# Patient Record
Sex: Female | Born: 1940 | Race: White | Hispanic: No | Marital: Married | Smoking: Former smoker
Health system: Southern US, Community
[De-identification: ages and names within clinical notes are randomized; demographics above are authoritative.]

## PROBLEM LIST (undated history)

## (undated) DIAGNOSIS — I10 Essential (primary) hypertension: Secondary | ICD-10-CM

## (undated) DIAGNOSIS — M8430XA Stress fracture, unspecified site, initial encounter for fracture: Secondary | ICD-10-CM

## (undated) DIAGNOSIS — F439 Reaction to severe stress, unspecified: Secondary | ICD-10-CM

## (undated) DIAGNOSIS — E7800 Pure hypercholesterolemia, unspecified: Secondary | ICD-10-CM

## (undated) HISTORY — DX: Essential (primary) hypertension: I10

## (undated) HISTORY — PX: RECTAL SURGERY: SHX760

## (undated) HISTORY — DX: Stress fracture, unspecified site, initial encounter for fracture: M84.30XA

## (undated) HISTORY — DX: Pure hypercholesterolemia, unspecified: E78.00

## (undated) HISTORY — DX: Reaction to severe stress, unspecified: F43.9

---

## 1971-07-09 HISTORY — PX: OTHER SURGICAL HISTORY: SHX169

## 1998-02-08 ENCOUNTER — Other Ambulatory Visit: Admission: RE | Admit: 1998-02-08 | Discharge: 1998-02-08 | Payer: Self-pay | Admitting: Gynecology

## 1999-02-13 ENCOUNTER — Other Ambulatory Visit: Admission: RE | Admit: 1999-02-13 | Discharge: 1999-02-13 | Payer: Self-pay | Admitting: Gynecology

## 1999-04-24 ENCOUNTER — Encounter: Payer: Self-pay | Admitting: Gynecology

## 1999-04-24 ENCOUNTER — Encounter: Admission: RE | Admit: 1999-04-24 | Discharge: 1999-04-24 | Payer: Self-pay | Admitting: Gynecology

## 1999-07-16 ENCOUNTER — Encounter: Admission: RE | Admit: 1999-07-16 | Discharge: 1999-07-16 | Payer: Self-pay | Admitting: Sports Medicine

## 1999-07-16 ENCOUNTER — Encounter: Payer: Self-pay | Admitting: Sports Medicine

## 2000-02-22 ENCOUNTER — Other Ambulatory Visit: Admission: RE | Admit: 2000-02-22 | Discharge: 2000-02-22 | Payer: Self-pay | Admitting: Gynecology

## 2000-04-25 ENCOUNTER — Encounter: Admission: RE | Admit: 2000-04-25 | Discharge: 2000-04-25 | Payer: Self-pay | Admitting: Gynecology

## 2000-04-25 ENCOUNTER — Encounter: Payer: Self-pay | Admitting: Gynecology

## 2001-01-27 ENCOUNTER — Encounter: Payer: Self-pay | Admitting: Sports Medicine

## 2001-01-27 ENCOUNTER — Encounter: Admission: RE | Admit: 2001-01-27 | Discharge: 2001-01-27 | Payer: Self-pay | Admitting: Sports Medicine

## 2001-03-24 ENCOUNTER — Other Ambulatory Visit: Admission: RE | Admit: 2001-03-24 | Discharge: 2001-03-24 | Payer: Self-pay | Admitting: Gynecology

## 2001-03-26 ENCOUNTER — Ambulatory Visit (HOSPITAL_COMMUNITY): Admission: RE | Admit: 2001-03-26 | Discharge: 2001-03-26 | Payer: Self-pay | Admitting: Sports Medicine

## 2001-03-26 ENCOUNTER — Encounter: Payer: Self-pay | Admitting: Sports Medicine

## 2001-05-12 ENCOUNTER — Encounter: Admission: RE | Admit: 2001-05-12 | Discharge: 2001-05-12 | Payer: Self-pay | Admitting: Gynecology

## 2001-05-12 ENCOUNTER — Encounter: Payer: Self-pay | Admitting: Gynecology

## 2002-04-12 ENCOUNTER — Other Ambulatory Visit: Admission: RE | Admit: 2002-04-12 | Discharge: 2002-04-12 | Payer: Self-pay | Admitting: Gynecology

## 2002-05-13 ENCOUNTER — Encounter: Payer: Self-pay | Admitting: Gynecology

## 2002-05-13 ENCOUNTER — Encounter: Admission: RE | Admit: 2002-05-13 | Discharge: 2002-05-13 | Payer: Self-pay | Admitting: Gynecology

## 2003-04-12 ENCOUNTER — Ambulatory Visit (HOSPITAL_COMMUNITY): Admission: RE | Admit: 2003-04-12 | Discharge: 2003-04-12 | Payer: Self-pay | Admitting: Gastroenterology

## 2003-04-12 ENCOUNTER — Encounter (INDEPENDENT_AMBULATORY_CARE_PROVIDER_SITE_OTHER): Payer: Self-pay | Admitting: Specialist

## 2003-04-18 ENCOUNTER — Other Ambulatory Visit: Admission: RE | Admit: 2003-04-18 | Discharge: 2003-04-18 | Payer: Self-pay | Admitting: Gynecology

## 2003-05-24 ENCOUNTER — Encounter: Admission: RE | Admit: 2003-05-24 | Discharge: 2003-05-24 | Payer: Self-pay | Admitting: Gynecology

## 2003-07-19 ENCOUNTER — Ambulatory Visit (HOSPITAL_COMMUNITY): Admission: RE | Admit: 2003-07-19 | Discharge: 2003-07-19 | Payer: Self-pay | Admitting: Gastroenterology

## 2003-07-19 ENCOUNTER — Encounter (INDEPENDENT_AMBULATORY_CARE_PROVIDER_SITE_OTHER): Payer: Self-pay | Admitting: *Deleted

## 2004-04-23 ENCOUNTER — Other Ambulatory Visit: Admission: RE | Admit: 2004-04-23 | Discharge: 2004-04-23 | Payer: Self-pay | Admitting: Gynecology

## 2004-06-06 ENCOUNTER — Encounter: Admission: RE | Admit: 2004-06-06 | Discharge: 2004-06-06 | Payer: Self-pay | Admitting: Gynecology

## 2004-06-18 ENCOUNTER — Encounter: Admission: RE | Admit: 2004-06-18 | Discharge: 2004-06-18 | Payer: Self-pay | Admitting: Gynecology

## 2004-09-27 ENCOUNTER — Ambulatory Visit (HOSPITAL_COMMUNITY): Admission: RE | Admit: 2004-09-27 | Discharge: 2004-09-27 | Payer: Self-pay | Admitting: Cardiology

## 2005-04-29 ENCOUNTER — Other Ambulatory Visit: Admission: RE | Admit: 2005-04-29 | Discharge: 2005-04-29 | Payer: Self-pay | Admitting: Gynecology

## 2005-07-11 ENCOUNTER — Encounter: Admission: RE | Admit: 2005-07-11 | Discharge: 2005-07-11 | Payer: Self-pay | Admitting: Gynecology

## 2005-09-18 ENCOUNTER — Ambulatory Visit (HOSPITAL_BASED_OUTPATIENT_CLINIC_OR_DEPARTMENT_OTHER): Admission: RE | Admit: 2005-09-18 | Discharge: 2005-09-18 | Payer: Self-pay | Admitting: Orthopedic Surgery

## 2006-07-23 ENCOUNTER — Encounter: Admission: RE | Admit: 2006-07-23 | Discharge: 2006-07-23 | Payer: Self-pay | Admitting: Cardiology

## 2006-08-01 ENCOUNTER — Encounter: Admission: RE | Admit: 2006-08-01 | Discharge: 2006-08-01 | Payer: Self-pay | Admitting: Cardiology

## 2007-02-27 ENCOUNTER — Encounter: Admission: RE | Admit: 2007-02-27 | Discharge: 2007-02-27 | Payer: Self-pay | Admitting: Gastroenterology

## 2007-06-03 ENCOUNTER — Other Ambulatory Visit: Admission: RE | Admit: 2007-06-03 | Discharge: 2007-06-03 | Payer: Self-pay | Admitting: Gynecology

## 2007-08-12 ENCOUNTER — Encounter: Admission: RE | Admit: 2007-08-12 | Discharge: 2007-08-12 | Payer: Self-pay | Admitting: Gynecology

## 2008-08-15 ENCOUNTER — Encounter: Admission: RE | Admit: 2008-08-15 | Discharge: 2008-08-15 | Payer: Self-pay | Admitting: Gynecology

## 2008-08-22 ENCOUNTER — Encounter: Admission: RE | Admit: 2008-08-22 | Discharge: 2008-08-22 | Payer: Self-pay | Admitting: Gynecology

## 2009-02-06 ENCOUNTER — Encounter: Admission: RE | Admit: 2009-02-06 | Discharge: 2009-02-06 | Payer: Self-pay | Admitting: Gynecology

## 2009-02-15 ENCOUNTER — Ambulatory Visit: Payer: Self-pay | Admitting: Vascular Surgery

## 2009-08-16 ENCOUNTER — Encounter: Admission: RE | Admit: 2009-08-16 | Discharge: 2009-08-16 | Payer: Self-pay | Admitting: Gynecology

## 2010-03-14 ENCOUNTER — Ambulatory Visit: Payer: Self-pay | Admitting: Cardiology

## 2010-03-19 ENCOUNTER — Ambulatory Visit: Payer: Self-pay | Admitting: Cardiology

## 2010-07-19 ENCOUNTER — Ambulatory Visit: Payer: Self-pay | Admitting: Cardiology

## 2010-07-25 ENCOUNTER — Ambulatory Visit: Payer: Self-pay | Admitting: Cardiology

## 2010-07-28 ENCOUNTER — Encounter: Payer: Self-pay | Admitting: Gynecology

## 2010-07-29 ENCOUNTER — Encounter: Payer: Self-pay | Admitting: Gynecology

## 2010-07-29 ENCOUNTER — Encounter: Payer: Self-pay | Admitting: Cardiology

## 2010-07-30 ENCOUNTER — Other Ambulatory Visit: Payer: Self-pay | Admitting: Internal Medicine

## 2010-07-30 DIAGNOSIS — Z1239 Encounter for other screening for malignant neoplasm of breast: Secondary | ICD-10-CM

## 2010-08-22 ENCOUNTER — Ambulatory Visit
Admission: RE | Admit: 2010-08-22 | Discharge: 2010-08-22 | Disposition: A | Discharge status: Home / Self Care | Payer: Medicare Other | Source: Ambulatory Visit | Attending: Internal Medicine | Admitting: Internal Medicine

## 2010-08-22 DIAGNOSIS — Z1239 Encounter for other screening for malignant neoplasm of breast: Secondary | ICD-10-CM

## 2010-10-07 ENCOUNTER — Other Ambulatory Visit: Payer: Self-pay | Admitting: Cardiology

## 2010-10-07 DIAGNOSIS — I10 Essential (primary) hypertension: Secondary | ICD-10-CM

## 2010-11-20 NOTE — Procedures (Signed)
LOWER EXTREMITY VENOUS REFLUX EXAM   INDICATION:  Lower extremity varicose veins.   EXAM:  Using color-flow imaging and pulse Doppler spectral analysis, the  right and left common femoral, superficial femoral, popliteal, posterior  tibial, greater and lesser saphenous veins are evaluated.  There is  evidence suggesting deep venous insufficiency in the right and left  lower extremities.   The left saphenofemoral junction is not competent, the right is within  normal limits.  The left GSV is not competent with the caliber as  described below, the right is within normal limits.   The right and left proximal short saphenous veins demonstrate  competency.   GSV Diameter (used if found to be incompetent only)                                            Right    Left  Proximal Greater Saphenous Vein           cm       0.75 cm  Proximal-to-mid-thigh                     cm         cm  Mid thigh                                 cm       0.49 cm  Mid-distal thigh                          cm         cm  Distal thigh                              cm       0.64 cm  Knee                                      cm       0.44 cm   IMPRESSION:  1. Left greater saphenous vein reflux is identified with the caliber      ranging from 0.44 cm to 0.79 cm knee to groin.  The right is within      normal limits.  2. The right and left greater saphenous veins are not aneurysmal.  3. The right and left greater saphenous veins are not tortuous.  4. The deep venous system is not competent.  5. The right and left lesser saphenous veins are competent.   ___________________________________________  Larina Earthly, M.D.   AS/MEDQ  D:  02/15/2009  T:  02/15/2009  Job:  [id number]

## 2010-11-20 NOTE — Consult Note (Signed)
NEW PATIENT CONSULTATION   Maxfield, Shyvonne A  DOB:  08-Feb-1941                                       02/15/2009  BJYNW#:[id number]   The patient presents today for evaluation of multiple lower extremity  complaints.  She is a very active 70 year old white female who reports  bilateral calf cramping at night.  She also has some bilateral lower  extremity swelling and notes telangiectasia on both thighs and more  pronounced reticular veins in her left anterior thigh and left popliteal  space.  She does not have any history of venous varicosities or  bleeding.   PAST MEDICAL HISTORY:  Is noted for hypertension and elevated  cholesterol.   SOCIAL HISTORY:  She is retired.  She does not smoke having quit nearly  40 years ago and does not drink alcohol.   REVIEW OF SYSTEMS:  Otherwise unremarkable.  Her weight is reported at  165 pounds.  She is 5 feet 7-1/2 inches tall.   ALLERGIES:  Iodine.   CURRENT MEDICATIONS:  Two blood pressure medicines which she is not  clear of the name.  She is on daily aspirin therapy and on vitamin D.   PHYSICAL EXAMINATION:  A well-developed, well-nourished white female  appearing stated age of 29.  Blood pressure is 126/77, heart rate 62,  respirations 18.  Her posterior tibial pulses are 2+ bilaterally.  She  is a well-developed white female in no acute distress.  She does not  have any swelling currently.  She does have scattered telangiectasia  over both thighs and both calves and does have some reticular  varicosities in her left anterior thigh and posterior popliteal fossa on  the left.   She underwent noninvasive vascular laboratory study and this did show  deep venous reflux bilaterally.  She had no reflux in her right great  saphenous vein and no reflux in her small saphenous vein bilaterally.  She did have reflux throughout her left great saphenous vein.  I had a  long discussion with the patient regarding this.  I  explained that this  is a safe condition and is not causing her any symptoms.  Since she is  having swelling bilaterally I would not recommend any treatment.  I did  explain that the deep venous reflux is treated only with elevation and  compression and that the reflux in her left great saphenous vein could  be treated with laser ablation but certainly would not recommend this  since she is minimally symptomatic.  I did explain the possibility of  sclerotherapy for the reticular veins and telangiectasia for cosmetic  concern.  She is not interested in this currently and will notify us  should she wish to proceed with this in the future.  She was reassured  with this discussion and will see [place] on an as-needed basis.   Larina Earthly, M.D.  Electronically Signed   TFE/MEDQ  D:  02/15/2009  T:  02/16/2009  Job:  [id number]   cc:   Dr Justin Mend, M.D.

## 2010-11-22 ENCOUNTER — Telehealth: Payer: Self-pay | Admitting: Cardiology

## 2010-11-22 DIAGNOSIS — R42 Dizziness and giddiness: Secondary | ICD-10-CM

## 2010-11-22 DIAGNOSIS — E7800 Pure hypercholesterolemia, unspecified: Secondary | ICD-10-CM

## 2010-11-22 DIAGNOSIS — Z79899 Other long term (current) drug therapy: Secondary | ICD-10-CM

## 2010-11-22 DIAGNOSIS — I10 Essential (primary) hypertension: Secondary | ICD-10-CM

## 2010-11-22 NOTE — Telephone Encounter (Signed)
Pt called said she is having prolems with BP but wouldn't tell me what kind Please call

## 2010-11-22 NOTE — Telephone Encounter (Signed)
Patient called for an appointment, was due for a month follow up.  Stated she has some "tingleing" all over today, but insisted she was ok.  Asked if blood pressure checked today and she stated no, but would check later on.  She stated next week ok.  Also having some dizziness, stated has had in past.  States still ok with waiting until next week.  Stated if she thought she needed to be seen she would be here.

## 2010-11-23 NOTE — Op Note (Signed)
   NAME:  Cynthia Jenkins, Cynthia Jenkins                          ACCOUNT NO.:  [id number]   MEDICAL RECORD NO.:  [id number]                   PATIENT TYPE:  AMB   LOCATION:  ENDO                                 FACILITY:  [facility]   PHYSICIAN:  Graylin Shiver, M.D.                DATE OF BIRTH:  1940-10-03   DATE OF PROCEDURE:  04/12/2003  DATE OF DISCHARGE:                                 OPERATIVE REPORT   PROCEDURE PERFORMED:  Esophagogastroduodenoscopy with biopsy.   ENDOSCOPIST:  Graylin Shiver, M.D.   INDICATIONS FOR PROCEDURE:  Epigastric pain.   Informed consent was obtained after the explanation of the risks of  bleeding, infection, perforation.   PREMEDICATIONS:  Fentanyl 25 mcg IV, Versed 2 mg IV.  The patient was also  given preprocedure antibiotics for subacute bacterial endocarditis  prophylaxis using ampicillin 2g IV and gentamicin 40 mg IV.   DESCRIPTION OF PROCEDURE:  With the patient in the left lateral decubitus  position the Olympus gastroscope was inserted into the oropharynx and passed  into the esophagus.  It was advanced down the esophagus and into the stomach  and into the duodenum.  The second portion and bulb of the duodenum looked  normal.  In the antrum of the stomach, there was a 5 mm prepyloric antral  ulcer, several biopsies were obtained from around this ulcer.  The body of  the stomach showed some gastritis as did the antrum.  The fundus and cardia  looked normal as far as the mucosa was concerned.  There was a small hiatal  hernia.  The esophagus showed a small healing erosion in the distal  esophagus just above the esophagogastric junction, which was at 35 cm.  The  mid and proximal esophagus looked normal.  The patient tolerated the  procedure well without complications.   IMPRESSION:  1. Antral ulcer.  2. Hiatal hernia.  3. Small healing erosion in the distal esophagus just above the     esophagogastric junction.   PLAN:  The biopsies will be checked.  I  will give the patient a prescription  for Aciphex 20 mg daily.  If Helicobacter pylori shows up on biopsies, it  will be treated.                                               Graylin Shiver, M.D.    [name]  D:  04/12/2003  T:  04/12/2003  Job:  [id number]   cc:   Cassell Clement, M.D.  [address]  [place]  [place] [address]  Fax: [phone]

## 2010-11-23 NOTE — Op Note (Signed)
NAME:  Cynthia Jenkins, Cynthia Jenkins                          ACCOUNT NO.:  [id number]   MEDICAL RECORD NO.:  [id number]                   PATIENT TYPE:  AMB   LOCATION:  ENDO                                 FACILITY:  [facility]   PHYSICIAN:  Graylin Shiver, M.D.                DATE OF BIRTH:  [birth year]   DATE OF PROCEDURE:  07/19/2003  DATE OF DISCHARGE:                                 OPERATIVE REPORT   PROCEDURE PERFORMED:  Colonoscopy with biopsy.   INDICATIONS FOR PROCEDURE:  Screening.   Informed consent was obtained after explanation of the risks of bleeding,  infection, and perforation.   PREMEDICATIONS:  Fentanyl 70 mcg  IV, Versed 7 mg IV.  The patient also  received preprocedure antibiotics of ampicillin 2 g IV, gentamicin 40 mg IV.   DESCRIPTION OF PROCEDURE:  With the patient in the left lateral decubitus  position, a rectal exam was performed and no masses were felt.  The Olympus  colonoscope was inserted into the rectum and advanced around the colon to  the cecum.  Cecal landmarks were identified.  The cecum was normal.  In the  midascending colon there was a small 3 mm sessile polyp removed with cold  forceps.  The transverse colon was normal.  The descending colon, sigmoid  and rectum were normal.  The patient tolerated the procedure well without  complications.   IMPRESSION:  Small polyp in the ascending colon.   PLAN:  The pathology will be checked.                                               Graylin Shiver, M.D.    Cynthia Jenkins  D:  07/19/2003  T:  07/19/2003  Job:  [id number]

## 2010-11-23 NOTE — Op Note (Signed)
Cynthia Jenkins, Cynthia Jenkins                ACCOUNT NO.:  [id number]   MEDICAL RECORD NO.:  [id number]          PATIENT TYPE:  AMB   LOCATION:  DSC                          FACILITY:  [facility]   PHYSICIAN:  Leonides Grills, M.D.     DATE OF BIRTH:  1940-10-14   DATE OF PROCEDURE:  09/18/2005  DATE OF DISCHARGE:                                 OPERATIVE REPORT   PREOPERATIVE DIAGNOSES:  1.  Left hallux valgus.  2.  Left fourth hammer toe.   POSTOPERATIVE DIAGNOSES:  1.  Left hallux valgus.  2.  Left fourth hammer toe.   OPERATION PERFORMED:  1.  Left chevron bunionectomy.  2.  Stress x-rays left foot.  3.  Left fourth toe metatarsophalangeal joint dorsal capsulotomy with      collateral release.  4.  Left fourth toe proximal phalanx head resection.  5.  Left fourth toe extensor digitorum brevis to extensor digitorum longus      tendon transfer.   SURGEON:  Leonides Grills, M.D.   ASSISTANT:  Lianne Cure, P.A.   ANESTHESIA:  General.   ESTIMATED BLOOD LOSS:  Minimal.   TOURNIQUET TIME:  Approximately 45 minutes.   COMPLICATIONS:  None.   DISPOSITION:  Stable to PR.   INDICATIONS FOR PROCEDURE:  The patient is a 70 year old female who has had  longstanding bunion as well as fourth toe pain. The pain was located in the  dorsal aspect of the PIP joint of the fourth toe and over the bunion area of  the left great toe.  This was persisting despite wearing wider toe box,  extra depth shoes as well as occasional anti-inflammatories.  The patient  has consented for the above procedure.  All risks which include infection,  neurovascular injury, nonunion, malunion, hardware irritation, hardware  failure, persistent pain, worsening pain, prolonged recovery, stiffness,  arthritis and possible fusion, recurrence of deformity, were all explained,  questions were encouraged and answered.   DESCRIPTION OF PROCEDURE:  The patient was brought to the operating room and  placed in supine position  after adequate general endotracheal tube  anesthesia was administered with block as well as Ancef 1 g IV piggyback.  The left lower extremity was then prepped and draped in sterile manner over  a proximally placed thigh tourniquet.  The wound was gravity exsanguinated  and tourniquet was elevated to 290 mmHg.  A longitudinal incision midline  over the medial aspect of the great toe MTP joint of the left foot was then  made.  Dissection was carried down through skin.  Hemostasis was obtained.  Neurovascular structures were identified both superiorly and inferiorly and  protected throughout the case. L-shaped capsulotomy was then made.  Simple  bunionectomy was then performed with a sagittal saw.  Ken-Johnson's ridge  was then rounded off with a rongeur.  Soft tissue was elevated superiorly  and inferiorly for the chevron osteotomy and screw placement.  Lateral  capsule was then released with a curved beaver blade.  Center of the head  was then identified and a chevron osteotomy was then created with a sagittal  saw.  Head was then translated approximately 3 mm laterally and affixed with  a 2.0 mm fully threaded cortical set screw using a 1.5 mm drill hole  respectively with the assistant applying axial compression.  There was  excellent purchase and maintenance of the correction.  Redundant bone  medially was then trimmed off with a sagittal saw.  Joint and area were  copiously irrigated with normal saline.  Capsule was reconstructed and  advanced superiorly and proximally with 2-0 Vicryl suture.  Stress x-rays  were obtained in the AP and lateral planes and showed toe in excellent  alignment, fixation, proper position and no gross motion across the  osteotomy site.  The wound was then copiously irrigated with normal saline.  Longitudinal  incision was made over the fourth toe.  Dissection was carried down through  skin and hemostasis was obtained.  Extensor digitorum longus and brevis  were  identified.  The longus tendon was tenotomized proximal and medial and the  brevis distal and lateral and retracted out of harm's way for later  transfer.  An MTP joint dorsal capsulotomy with collateral release was then  performed with a 15 blade scalpel with the assistant protecting the medial  and lateral neurovascular structures.  This had excellent release of the  tight capsule.  The distal aspect of the proximal phalanx head was then  skeletonized and the head was then removed with a rongeur followed by a bone  cutter.  The toe was then reduced and was adequately decompressed.  Area was  copiously irrigated with normal saline once again.  A 0.045 K-wire was  advanced antegrade through the middle and distal phalanx.  PIP joint was  then reduced and this was fired retrograde.  MTP joint and toe was held in a  reduced position and a K-wire was advanced across the MTP joint.  The K-wire  was bent cut and capped.  Skin relieving incisions were made on either side  of the K-wire as well.  EDB to EDL tendons were then transferred with 3-0  PDS suture.  This had an outstanding transfer and repair.  Again, the area  was copiously irrigated with normal saline.  Tourniquet was deflated,  hemostasis was obtained.  All wounds were closed with 4-0 nylon suture after  the wounds were copiously irrigated with normal saline.  Skin relieving  incisions were made on either side of the K-wire and again the K-wire was  bent, cut and capped.  Sterile dressing was applied.  Roger-Mann dressing  was applied. Hard sole shoe was applied.  The patient was stable to the PR.      Leonides Grills, M.D.  Electronically Signed     PB/MEDQ  D:  09/18/2005  T:  09/19/2005  Job:  [id number]

## 2010-11-26 ENCOUNTER — Other Ambulatory Visit (INDEPENDENT_AMBULATORY_CARE_PROVIDER_SITE_OTHER): Payer: Medicare Other | Admitting: *Deleted

## 2010-11-26 DIAGNOSIS — I10 Essential (primary) hypertension: Secondary | ICD-10-CM

## 2010-11-26 DIAGNOSIS — R42 Dizziness and giddiness: Secondary | ICD-10-CM

## 2010-11-26 DIAGNOSIS — E7800 Pure hypercholesterolemia, unspecified: Secondary | ICD-10-CM

## 2010-11-26 DIAGNOSIS — Z79899 Other long term (current) drug therapy: Secondary | ICD-10-CM

## 2010-11-26 LAB — BASIC METABOLIC PANEL
BUN: 13 mg/dL (ref 6–23)
CO2: 29 mEq/L (ref 19–32)
Calcium: 9 mg/dL (ref 8.4–10.5)
Creatinine, Ser: 0.9 mg/dL (ref 0.4–1.2)
GFR: 69.09 mL/min (ref >60.00)
Glucose, Bld: 99 mg/dL (ref 70–99)
Potassium: 4.1 mEq/L (ref 3.5–5.1)
Sodium: 140 mEq/L (ref 135–145)

## 2010-11-26 LAB — CBC WITH DIFFERENTIAL/PLATELET
Basophils Absolute: 0 10*3/uL (ref 0.0–0.1)
Basophils Relative: 0.3 % (ref 0.0–3.0)
Eosinophils Absolute: 0.2 10*3/uL (ref 0.0–0.7)
Eosinophils Relative: 2.3 % (ref 0.0–5.0)
HCT: 42.4 % (ref 36.0–46.0)
Hemoglobin: 14.4 g/dL (ref 12.0–15.0)
Lymphocytes Relative: 40.1 % (ref 12.0–46.0)
MCHC: 33.9 g/dL (ref 30.0–36.0)
MCV: 93.6 fl (ref 78.0–100.0)
Monocytes Absolute: 0.5 10*3/uL (ref 0.1–1.0)
Monocytes Relative: 6.5 % (ref 3.0–12.0)
Neutro Abs: 3.5 10*3/uL (ref 1.4–7.7)
Neutrophils Relative %: 50.8 % (ref 43.0–77.0)
RBC: 4.52 Mil/uL (ref 3.87–5.11)
RDW: 13.2 % (ref 11.5–14.6)
WBC: 7 10*3/uL (ref 4.5–10.5)

## 2010-11-26 LAB — HEPATIC FUNCTION PANEL
ALT: 28 U/L (ref 0–35)
AST: 24 U/L (ref 0–37)
Albumin: 3.2 g/dL — ABNORMAL LOW (ref 3.5–5.2)
Alkaline Phosphatase: 51 U/L (ref 39–117)
Bilirubin, Direct: 0.1 mg/dL (ref 0.0–0.3)
Total Protein: 5.9 g/dL — ABNORMAL LOW (ref 6.0–8.3)

## 2010-11-26 LAB — LIPID PANEL
Cholesterol: 154 mg/dL (ref 0–200)
HDL: 46.9 mg/dL (ref >39.00)
LDL Cholesterol: 87 mg/dL (ref 0–99)
Total CHOL/HDL Ratio: 3
Triglycerides: 99 mg/dL (ref 0.0–149.0)
VLDL: 19.8 mg/dL (ref 0.0–40.0)

## 2010-11-28 ENCOUNTER — Encounter: Payer: Self-pay | Admitting: *Deleted

## 2010-11-28 DIAGNOSIS — F439 Reaction to severe stress, unspecified: Secondary | ICD-10-CM | POA: Insufficient documentation

## 2010-11-28 DIAGNOSIS — E7800 Pure hypercholesterolemia, unspecified: Secondary | ICD-10-CM | POA: Insufficient documentation

## 2010-11-28 DIAGNOSIS — M8430XA Stress fracture, unspecified site, initial encounter for fracture: Secondary | ICD-10-CM | POA: Insufficient documentation

## 2010-11-29 ENCOUNTER — Encounter: Payer: Self-pay | Admitting: Cardiology

## 2010-11-29 ENCOUNTER — Ambulatory Visit (INDEPENDENT_AMBULATORY_CARE_PROVIDER_SITE_OTHER): Payer: Medicare Other | Admitting: Cardiology

## 2010-11-29 DIAGNOSIS — E7800 Pure hypercholesterolemia, unspecified: Secondary | ICD-10-CM

## 2010-11-29 DIAGNOSIS — I119 Hypertensive heart disease without heart failure: Secondary | ICD-10-CM | POA: Insufficient documentation

## 2010-11-29 NOTE — Assessment & Plan Note (Signed)
This 70 year old woman has A history of high cholesterol.She is presently on pravastatin 40 mg a day.  She is not having any side effects of myalgias from the pravastatin.  Her blood work results from earlier this week are satisfactory.  She is discouraged by her 3 pound weight gain.  She wondered if any of her medication was causing her to gain weight.  We reviewed all of her medicines and concluded that none of her medicines were causing weight gain.

## 2010-11-29 NOTE — Assessment & Plan Note (Signed)
The patient has a history of essential hypertension.  She is presently well controlled on a combination of losartan HCT and carvedilol.She is not having chest pain or shortness of breath.  She has not been as physically active over the spring and she no longer goes to the gym.  Some of her weight gain is secondary to the fact that she is not doing any gym exercises.

## 2010-11-29 NOTE — Progress Notes (Signed)
Cynthia Jenkins Date of Birth:  January 15, 1941 [facility] [address]   [address] [place], [place]  [address] [phone]           Fax   [phone]  History of Present Illness: This pleasant 70 normal is seen for a scheduled followup office visit.  She has a past history of essential hypertension and elevated cholesterol.  She does not have any history of coronary disease.  She underwent a nuclear stress test on 09/16/07 which showed no ischemia and good exercise tolerance and an ejection fraction of 65%.  With exercise she did have frequent PACs and PVCs and short runs of consecutive PVCs at peak exercise necessitating the ongoing need for a beta blocker of some type.  The patient had an echocardiogram 02/28/03 which showed normal left ventricle systolic and diastolic function and mild mitral regurgitation.  The patient had a perfusion lung scan in 2000 and because of dyspnea and she had a normal perfusion lung scan no evidence to suggest pulmonary embolus.  Since last visit she has been feeling well.  She's having no chest pain or shortness of breath.  She's not having any palpitations dizziness or syncope  Current Outpatient Prescriptions  Medication Sig Dispense Refill  . aspirin 81 MG tablet Take 81 mg by mouth daily.        . carvedilol (COREG) 25 MG tablet Take 25 mg by mouth 2 (two) times daily with a meal.        . Cholecalciferol (VITAMIN D PO) Take by mouth daily.        . Cyanocobalamin (VITAMIN B 12 PO) Take by mouth daily.        Marland Kitchen losartan-hydrochlorothiazide (HYZAAR) 100-25 MG per tablet TAKE 1 TABLET DAILY (PT TO DISCONTINUE AVALIDE AND BYSTOLIC)  90 tablet  3  . Omega-3 Fatty Acids (FISH OIL PO) Take by mouth 2 (two) times daily.        . Omeprazole (PRILOSEC PO) Take by mouth daily.        . pravastatin (PRAVACHOL) 40 MG tablet Take 40 mg by mouth daily.          Allergies  Allergen Reactions  . Aceon (Perindopril Erbumine)     cough  . Codeine    . Crestor (Rosuvastatin Calcium)     aching  . Diovan (Valsartan)     Dizzy,rash  . Norvasc (Amlodipine Besylate)   . Zetia (Ezetimibe)     cramps    Patient Active Problem List  Diagnoses  . Hypercholesterolemia  . Situational stress  . Stress fracture  . Benign hypertensive heart disease without heart failure    History  Smoking status  . Former Smoker  . Types: Cigarettes  Smokeless tobacco  . Not on file    History  Alcohol Use     Family History  Problem Relation Age of Onset  . Heart disease Mother   . Diabetes Mother   . Emphysema Father   . Ovarian cancer Sister   . Coronary artery disease Brother     Review of Systems: Constitutional: no fever chills diaphoresis or fatigue or change in weight.  Head and neck: no hearing loss, no epistaxis, no photophobia or visual disturbance. Respiratory: No cough, shortness of breath or wheezing. Cardiovascular: No chest pain peripheral edema, palpitations. Gastrointestinal: No abdominal distention, no abdominal pain, no change in bowel habits hematochezia or melena. Genitourinary: No dysuria, no frequency, no urgency, no nocturia. Musculoskeletal:No arthralgias, no  back pain, no gait disturbance or myalgias. Neurological: No dizziness, no headaches, no numbness, no seizures, no syncope, no weakness, no tremors. Hematologic: No lymphadenopathy, no easy bruising. Psychiatric: No confusion, no hallucinations, no sleep disturbance.    Physical Exam: Filed Vitals:   11/29/10 1551  BP: 126/78  Pulse: 78  The general appearance reveals a well-developed well-nourished woman in no distress.Pupils equal and reactive.   Extraocular Movements are full.  There is no scleral icterus.  The mouth and pharynx are normal.  The neck is supple.  The carotids reveal no bruits.  The jugular venous pressure is normal.  The thyroid is not enlarged.  There is no lymphadenopathy.The chest is clear to percussion and auscultation. There  are no rales or rhonchi. Expansion of the chest is symmetrical.The precordium is quiet.  The first heart sound is normal.  The second heart sound is physiologically split.  There is no murmur gallop rub or click.  There is no abnormal lift or heave.The abdomen is soft and nontender. Bowel sounds are normal. The liver and spleen are not enlarged. There Are no abdominal masses. There are no bruits.The pedal pulses are good.  There is no phlebitis or edema.  There is no cyanosis or clubbing.Strength is normal and symmetrical in all extremities.  There is no lateralizing weakness.  There are no sensory deficits.The skin is warm and dry.  There is no rash.   Assessment / Plan:  We reviewed her labs which are satisfactory.  She is to continue same med and be rechecked in 4 months and she'll come in ahead of time for lab work

## 2011-01-01 ENCOUNTER — Other Ambulatory Visit: Payer: Self-pay | Admitting: Cardiology

## 2011-01-01 DIAGNOSIS — E785 Hyperlipidemia, unspecified: Secondary | ICD-10-CM

## 2011-01-01 NOTE — Telephone Encounter (Signed)
escribe request  

## 2011-02-09 ENCOUNTER — Other Ambulatory Visit: Payer: Self-pay | Admitting: Cardiology

## 2011-02-09 DIAGNOSIS — I119 Hypertensive heart disease without heart failure: Secondary | ICD-10-CM

## 2011-02-11 NOTE — Telephone Encounter (Signed)
escribe request  

## 2011-03-28 ENCOUNTER — Ambulatory Visit (INDEPENDENT_AMBULATORY_CARE_PROVIDER_SITE_OTHER): Payer: Medicare Other | Admitting: *Deleted

## 2011-03-28 DIAGNOSIS — E7800 Pure hypercholesterolemia, unspecified: Secondary | ICD-10-CM

## 2011-03-28 DIAGNOSIS — I119 Hypertensive heart disease without heart failure: Secondary | ICD-10-CM

## 2011-03-28 LAB — BASIC METABOLIC PANEL
Calcium: 9.1 mg/dL (ref 8.4–10.5)
Creatinine, Ser: 0.8 mg/dL (ref 0.4–1.2)
GFR: 76.13 mL/min (ref >60.00)

## 2011-03-28 LAB — LIPID PANEL
Cholesterol: 157 mg/dL (ref 0–200)
Triglycerides: 84 mg/dL (ref 0.0–149.0)

## 2011-03-28 LAB — HEPATIC FUNCTION PANEL
AST: 27 U/L (ref 0–37)
Albumin: 3.7 g/dL (ref 3.5–5.2)
Alkaline Phosphatase: 57 U/L (ref 39–117)
Total Protein: 6.5 g/dL (ref 6.0–8.3)

## 2011-04-01 ENCOUNTER — Encounter: Payer: Self-pay | Admitting: Cardiology

## 2011-04-01 ENCOUNTER — Ambulatory Visit (INDEPENDENT_AMBULATORY_CARE_PROVIDER_SITE_OTHER): Payer: Medicare Other | Admitting: Cardiology

## 2011-04-01 VITALS — BP 122/80 | HR 60 | Wt 172.0 lb

## 2011-04-01 DIAGNOSIS — E7800 Pure hypercholesterolemia, unspecified: Secondary | ICD-10-CM

## 2011-04-01 DIAGNOSIS — Z733 Stress, not elsewhere classified: Secondary | ICD-10-CM

## 2011-04-01 DIAGNOSIS — I119 Hypertensive heart disease without heart failure: Secondary | ICD-10-CM

## 2011-04-01 DIAGNOSIS — F439 Reaction to severe stress, unspecified: Secondary | ICD-10-CM

## 2011-04-01 NOTE — Assessment & Plan Note (Signed)
The patient has a history of dyslipidemia.  She is careful with her diet.  Her weight is down since last visit.   she is presently tolerating pravastatin 40 mg daily.She denies any myalgias.We reviewed her blood work from 4 days ago which shows that her lipid control is satisfactory with an LDL of 91

## 2011-04-01 NOTE — Progress Notes (Signed)
Cynthia Jenkins Date of Birth:  05-01-1941 [facility] [address]   [address] [place], [place]  [address] [phone]           Fax   [phone]  History of Present Illness: This pleasant 70 year old Caucasian female is seen for a scheduled followup office visit.  She has a past history of essential hypertension.  She also has a history of dyslipidemia.  She does not have any history of coronary artery disease.  She underwent a nuclear stress test on 09/16/07 which did not show any ischemia and showed an ejection fraction of 65%.  With exercise she has frequent PACs and PVCs and short runs of consecutive PVCs which have been successfully suppressed with beta blocker.  Her echocardiogram in 2004 showed normal systolic and diastolic function and mild mitral regurgitation.  She had a perfusion lung scan in 2000 which did not show any evidence of pulmonary embolus.  Current Outpatient Prescriptions  Medication Sig Dispense Refill  . aspirin 81 MG tablet Take 81 mg by mouth daily.        . carvedilol (COREG) 25 MG tablet TAKE 1 TABLET TWICE A DAY  180 tablet  3  . Cholecalciferol (VITAMIN D PO) Take by mouth daily.        . Cyanocobalamin (VITAMIN B 12 PO) Take by mouth daily.        Marland Kitchen losartan-hydrochlorothiazide (HYZAAR) 100-25 MG per tablet TAKE 1 TABLET DAILY (PT TO DISCONTINUE AVALIDE AND BYSTOLIC)  90 tablet  3  . Omega-3 Fatty Acids (FISH OIL PO) Take by mouth 2 (two) times daily.        . Omeprazole (PRILOSEC PO) Take by mouth daily.        . pravastatin (PRAVACHOL) 40 MG tablet TAKE 1 TABLET DAILY  90 tablet  3    Allergies  Allergen Reactions  . Aceon (Perindopril Erbumine)     cough  . Codeine   . Crestor (Rosuvastatin Calcium)     aching  . Diovan (Valsartan)     Dizzy,rash  . Norvasc (Amlodipine Besylate)   . Zetia (Ezetimibe)     cramps    Patient Active Problem List  Diagnoses  . Hypercholesterolemia  . Situational stress  . Stress  fracture  . Benign hypertensive heart disease without heart failure    History  Smoking status  . Former Smoker  . Types: Cigarettes  Smokeless tobacco  . Not on file    History  Alcohol Use     Family History  Problem Relation Age of Onset  . Heart disease Mother   . Diabetes Mother   . Emphysema Father   . Ovarian cancer Sister   . Coronary artery disease Brother     Review of Systems: Constitutional: no fever chills diaphoresis or fatigue or change in weight.  Head and neck: no hearing loss, no epistaxis, no photophobia or visual disturbance. Respiratory: No cough, shortness of breath or wheezing. Cardiovascular: No chest pain peripheral edema, palpitations. Gastrointestinal: No abdominal distention, no abdominal pain, no change in bowel habits hematochezia or melena. Genitourinary: No dysuria, no frequency, no urgency, no nocturia. Musculoskeletal:No arthralgias, no back pain, no gait disturbance or myalgias. Neurological: No dizziness, no headaches, no numbness, no seizures, no syncope, no weakness, no tremors. Hematologic: No lymphadenopathy, no easy bruising. Psychiatric: No confusion, no hallucinations, no sleep disturbance.    Physical Exam: Filed Vitals:   04/01/11 0911  BP: 122/80  Pulse: 60  The general appearance reveals a well-developed well-nourished woman in no distress.  Pupils equal and reactive.   Extraocular Movements are full.  There is no scleral icterus.  The mouth and pharynx are normal.  The neck is supple.  The carotids reveal no bruits.  The jugular venous pressure is normal.  The thyroid is not enlarged.  There is no lymphadenopathy.  The chest is clear to percussion and auscultation. There are no rales or rhonchi. Expansion of the chest is symmetrical.  The precordium is quiet.  The first heart sound is normal.  The second heart sound is physiologically split.  There is no murmur gallop rub or click.  There is no abnormal lift or  heave.  The abdomen is soft and nontender. Bowel sounds are normal. The liver and spleen are not enlarged. There Are no abdominal masses. There are no bruits.  Normal extremity without phlebitis or edema.  Pedal pulses are present.Strength is normal and symmetrical in all extremities.  There is no lateralizing weakness.  There are no sensory deficits.  The skin is warm and dry.  There is no rash.      Assessment / Plan: The patient will continue same medication.  Recheck in 4 months for followup office visit And lab work

## 2011-04-01 NOTE — Assessment & Plan Note (Signed)
The patient has not been expressing any chest pain or shortness of breath.  She denies any palpitations dizziness or syncope his not having any side effects from her present beta blocker or losartan hydrochlorothiazide combination.  She is walking a mile most days for exercise

## 2011-04-01 NOTE — Assessment & Plan Note (Signed)
The patient is still under a lot of situational stress related to her daughters divorce and child custody battles etc.

## 2011-06-17 ENCOUNTER — Encounter: Payer: Self-pay | Admitting: Cardiology

## 2011-07-29 ENCOUNTER — Other Ambulatory Visit: Payer: Self-pay | Admitting: Internal Medicine

## 2011-07-29 DIAGNOSIS — Z1231 Encounter for screening mammogram for malignant neoplasm of breast: Secondary | ICD-10-CM

## 2011-08-01 ENCOUNTER — Encounter: Payer: Self-pay | Admitting: Cardiology

## 2011-08-01 ENCOUNTER — Ambulatory Visit (INDEPENDENT_AMBULATORY_CARE_PROVIDER_SITE_OTHER): Payer: Medicare Other | Admitting: Cardiology

## 2011-08-01 ENCOUNTER — Other Ambulatory Visit: Payer: Medicare Other | Admitting: *Deleted

## 2011-08-01 VITALS — BP 120/80 | HR 70 | Ht 67.0 in | Wt 165.0 lb

## 2011-08-01 DIAGNOSIS — E7800 Pure hypercholesterolemia, unspecified: Secondary | ICD-10-CM

## 2011-08-01 DIAGNOSIS — I119 Hypertensive heart disease without heart failure: Secondary | ICD-10-CM

## 2011-08-01 DIAGNOSIS — Z733 Stress, not elsewhere classified: Secondary | ICD-10-CM

## 2011-08-01 DIAGNOSIS — F439 Reaction to severe stress, unspecified: Secondary | ICD-10-CM

## 2011-08-01 NOTE — Assessment & Plan Note (Signed)
The patient denies any headaches or dizzy spells.  She's having no chest pain or shortness of breath or palpitations.

## 2011-08-01 NOTE — Assessment & Plan Note (Signed)
The patient brought with her some recent blood work from her primary care physician.  Her lipids are satisfactory.  She will continue same medication.

## 2011-08-01 NOTE — Progress Notes (Signed)
Cynthia Jenkins Date of Birth:  04/18/1941 [facility] [address] [place], [place]  [address] [phone]  Fax   [phone]  HPI: This pleasant 71 year old woman is seen for a four-month followup office visit.  She has a history of hypercholesterolemia and essential hypertension.  Since last visit she's been doing well.  She has been careful with her diet and she has lost 7 pounds.  She has not been  Experiencing any chest pain or shortness of breath.  Current Outpatient Prescriptions  Medication Sig Dispense Refill  . aspirin 81 MG tablet Take 81 mg by mouth daily.        . carvedilol (COREG) 25 MG tablet TAKE 1 TABLET TWICE A DAY  180 tablet  3  . Cholecalciferol (VITAMIN D PO) Take by mouth daily.        . Cyanocobalamin (VITAMIN B 12 PO) Take by mouth daily.        Marland Kitchen losartan-hydrochlorothiazide (HYZAAR) 100-25 MG per tablet TAKE 1 TABLET DAILY (PT TO DISCONTINUE AVALIDE AND BYSTOLIC)  90 tablet  3  . Omega-3 Fatty Acids (FISH OIL PO) Take by mouth 2 (two) times daily.        . Omeprazole (PRILOSEC PO) Take by mouth daily.        . pravastatin (PRAVACHOL) 40 MG tablet TAKE 1 TABLET DAILY  90 tablet  3    Allergies  Allergen Reactions  . Aceon (Perindopril Erbumine)     cough  . Codeine   . Crestor (Rosuvastatin Calcium)     aching  . Diovan (Valsartan)     Dizzy,rash  . Norvasc (Amlodipine Besylate)   . Zetia (Ezetimibe)     cramps    Patient Active Problem List  Diagnoses  . Hypercholesterolemia  . Situational stress  . Stress fracture  . Benign hypertensive heart disease without heart failure    History  Smoking status  . Former Smoker  . Types: Cigarettes  Smokeless tobacco  . Not on file    History  Alcohol Use     Family History  Problem Relation Age of Onset  . Heart disease Mother   . Diabetes Mother   . Emphysema Father   . Ovarian cancer Sister   . Coronary artery disease Brother     Review of Systems: The  patient denies any heat or cold intolerance.  No weight gain or weight loss.  The patient denies headaches or blurry vision.  There is no cough or sputum production.  The patient denies dizziness.  There is no hematuria or hematochezia.  The patient denies any muscle aches or arthritis.  The patient denies any rash.  The patient denies frequent falling or instability.  There is no history of depression or anxiety.  All other systems were reviewed and are negative.   Physical Exam: Filed Vitals:   08/01/11 0946  BP: 120/80  Pulse: 70   The general appearance reveals a well-developed well-nourished woman in no distress.The head and neck exam reveals pupils equal and reactive.  Extraocular movements are full.  There is no scleral icterus.  The mouth and pharynx are normal.  The neck is supple.  The carotids reveal no bruits.  The jugular venous pressure is normal.  The  thyroid is not enlarged.  There is no lymphadenopathy.  The chest is clear to percussion and auscultation.  There are no rales or rhonchi.  Expansion of the chest is symmetrical.  The precordium is quiet.  The first heart sound is normal.  The second heart sound is physiologically split.  There is no murmur gallop rub or click.  There is no abnormal lift or heave.  The abdomen is soft and nontender.  The bowel sounds are normal.  The liver and spleen are not enlarged.  There are no abdominal masses.  There are no abdominal bruits.  Extremities reveal good pedal pulses.  There is no phlebitis or edema.  There is no cyanosis or clubbing.  Strength is normal and symmetrical in all extremities.  There is no lateralizing weakness.  There are no sensory deficits.  The skin is warm and dry.  There is no rash.     Assessment / Plan:  Continue same medication.  Recheck in 4 months for followup office visit EKG and lab work

## 2011-08-01 NOTE — Assessment & Plan Note (Signed)
The patient is having some increased heartburn symptoms related to stress.  Distress is related to her daughter's ongoing divorce proceedings.  I told her that if she needed to she could increase her omeprazole to twice a day.

## 2011-08-01 NOTE — Patient Instructions (Signed)
Your physician recommends that you return for lab work in: 4months for fasting cholesterol  Your physician wants you to follow-up in: 4 months with Dr. Rodney Langton will receive a reminder letter in the mail two months in advance. If you don't receive a letter, please call our office to schedule the follow-up appointment.  Your physician recommends that you continue on your current medications as directed. Please refer to the Current Medication list given to you today.

## 2011-08-18 ENCOUNTER — Other Ambulatory Visit: Payer: Self-pay | Admitting: Cardiology

## 2011-08-19 NOTE — Telephone Encounter (Signed)
Refilled losartan 

## 2011-08-28 ENCOUNTER — Ambulatory Visit
Admission: RE | Admit: 2011-08-28 | Discharge: 2011-08-28 | Disposition: A | Discharge status: Home / Self Care | Payer: Medicare Other | Source: Ambulatory Visit | Attending: Internal Medicine | Admitting: Internal Medicine

## 2011-08-28 DIAGNOSIS — Z1231 Encounter for screening mammogram for malignant neoplasm of breast: Secondary | ICD-10-CM

## 2011-11-27 ENCOUNTER — Other Ambulatory Visit: Payer: Self-pay | Admitting: Cardiology

## 2011-11-27 NOTE — Telephone Encounter (Signed)
Refilled pravastatin. 

## 2011-12-04 ENCOUNTER — Encounter: Payer: Self-pay | Admitting: Cardiology

## 2011-12-04 ENCOUNTER — Ambulatory Visit (INDEPENDENT_AMBULATORY_CARE_PROVIDER_SITE_OTHER): Payer: Medicare Other | Admitting: Cardiology

## 2011-12-04 ENCOUNTER — Other Ambulatory Visit (INDEPENDENT_AMBULATORY_CARE_PROVIDER_SITE_OTHER): Payer: Medicare Other

## 2011-12-04 VITALS — BP 115/65 | HR 53 | Ht 67.0 in | Wt 167.8 lb

## 2011-12-04 DIAGNOSIS — E7800 Pure hypercholesterolemia, unspecified: Secondary | ICD-10-CM

## 2011-12-04 DIAGNOSIS — I119 Hypertensive heart disease without heart failure: Secondary | ICD-10-CM

## 2011-12-04 LAB — BASIC METABOLIC PANEL
BUN: 18 mg/dL (ref 6–23)
CO2: 29 mEq/L (ref 19–32)
Chloride: 106 mEq/L (ref 96–112)
Glucose, Bld: 108 mg/dL — ABNORMAL HIGH (ref 70–99)
Potassium: 3.7 mEq/L (ref 3.5–5.1)
Sodium: 141 mEq/L (ref 135–145)

## 2011-12-04 LAB — HEPATIC FUNCTION PANEL
ALT: 19 U/L (ref 0–35)
AST: 21 U/L (ref 0–37)
Albumin: 3.3 g/dL — ABNORMAL LOW (ref 3.5–5.2)
Alkaline Phosphatase: 52 U/L (ref 39–117)

## 2011-12-04 LAB — LIPID PANEL: Cholesterol: 154 mg/dL (ref 0–200)

## 2011-12-04 NOTE — Assessment & Plan Note (Signed)
The patient has a history of hypercholesterolemia.  She is presently taking pravastatin.  Her recent blood work is satisfactory.  She's not having any myalgias or side effects.

## 2011-12-04 NOTE — Progress Notes (Signed)
Quick Note:  Please report to patient. The recent labs are stable. Continue same medication and careful diet. BS higher. Watch carbs. ______ 

## 2011-12-04 NOTE — Progress Notes (Signed)
Cynthia Jenkins Date of Birth:  24-Jul-1940 [facility] [address] [place], [place]  [address] [phone]  Fax   [phone]  HPI: This pleasant 71 year old woman is seen for a scheduled four-month followup office visit.  She has a history of hypercholesterolemia and essential hypertension.  Since last visit she has been doing well.  She feels that her blood pressure is too low at times.  She feels slightly dizzy or woozy at times.  He has not been expressing any chest discomfort.  Current Outpatient Prescriptions  Medication Sig Dispense Refill  . aspirin 81 MG tablet Take 81 mg by mouth daily.        . carvedilol (COREG) 25 MG tablet       . Cholecalciferol (VITAMIN D PO) Take by mouth daily.        . Cyanocobalamin (VITAMIN B 12 PO) Take by mouth daily.        Marland Kitchen losartan-hydrochlorothiazide (HYZAAR) 100-25 MG per tablet TAKE 1 TABLET DAILY (PT TO DISCONTINUE AVALIDE AND BYSTOLIC)  90 tablet  3  . Omega-3 Fatty Acids (FISH OIL PO) Take by mouth 2 (two) times daily.        . Omeprazole (PRILOSEC PO) Take by mouth daily.        . pravastatin (PRAVACHOL) 40 MG tablet TAKE 1 TABLET DAILY  90 tablet  3  . DISCONTD: carvedilol (COREG) 25 MG tablet TAKE 1 TABLET TWICE A DAY  180 tablet  3    Allergies  Allergen Reactions  . Aceon (Perindopril Erbumine)     cough  . Codeine   . Crestor (Rosuvastatin Calcium)     aching  . Diovan (Valsartan)     Dizzy,rash  . Norvasc (Amlodipine Besylate)   . Zetia (Ezetimibe)     cramps    Patient Active Problem List  Diagnoses  . Hypercholesterolemia  . Situational stress  . Stress fracture  . Benign hypertensive heart disease without heart failure    History  Smoking status  . Former Smoker  . Types: Cigarettes  Smokeless tobacco  . Not on file    History  Alcohol Use     Family History  Problem Relation Age of Onset  . Heart disease Mother   . Diabetes Mother   . Emphysema Father   . Ovarian cancer  Sister   . Coronary artery disease Brother     Review of Systems: The patient denies any heat or cold intolerance.  No weight gain or weight loss.  The patient denies headaches or blurry vision.  There is no cough or sputum production.  The patient denies dizziness.  There is no hematuria or hematochezia.  The patient denies any muscle aches or arthritis.  The patient denies any rash.  The patient denies frequent falling or instability.  There is no history of depression or anxiety.  All other systems were reviewed and are negative.   Physical Exam: Filed Vitals:   12/04/11 0846  BP: 115/65  Pulse: 53   the general appearance reveals a well-developed well-nourished woman in no distress.The head and neck exam reveals pupils equal and reactive.  Extraocular movements are full.  There is no scleral icterus.  The mouth and pharynx are normal.  The neck is supple.  The carotids reveal no bruits.  The jugular venous pressure is normal.  The  thyroid is not enlarged.  There is no lymphadenopathy.  The chest is clear to percussion and auscultation.  There are  no rales or rhonchi.  Expansion of the chest is symmetrical.  The precordium is quiet.  The first heart sound is normal.  The second heart sound is physiologically split.  There is no murmur gallop rub or click.  There is no abnormal lift or heave.  The abdomen is soft and nontender.  The bowel sounds are normal.  The liver and spleen are not enlarged.  There are no abdominal masses.  There are no abdominal bruits.  Extremities reveal good pedal pulses.  There is no phlebitis or edema.  There is no cyanosis or clubbing.  Strength is normal and symmetrical in all extremities.  There is no lateralizing weakness.  There are no sensory deficits.  The skin is warm and dry.  There is no rash.    EKG shows sinus bradycardia with sinus arrhythmia and minor nonspecific ST-T wave changes  Assessment / Plan: Continue same medication except reduce carvedilol to  one half tablet twice a day.  She would like to be rechecked in 4-6 weeks for a followup office visit to be sure that her blood pressure is stable.

## 2011-12-04 NOTE — Assessment & Plan Note (Signed)
The patient has been checking her blood pressure at home and it often runs low.  She has been feeling more lightheaded.  Her pulse is slow and we are going to decrease her carvedilol to just 12.5 mg twice a day and observe response

## 2011-12-04 NOTE — Patient Instructions (Signed)
Decrease your Carvedilol to 1/2 tablet twice a day   Your physician recommends that you schedule a follow-up appointment in: 4 - 6 weeks

## 2011-12-05 ENCOUNTER — Telehealth: Payer: Self-pay | Admitting: *Deleted

## 2011-12-05 NOTE — Telephone Encounter (Signed)
Message copied by Burnell Blanks on Thu Dec 05, 2011  5:28 PM ------      Message from: Cassell Clement      Created: Wed Dec 04, 2011  8:55 PM       Please report to patient.  The recent labs are stable. Continue same medication and careful diet.BS higher. Watch carbs

## 2011-12-05 NOTE — Telephone Encounter (Signed)
Advised of labs 

## 2012-01-08 ENCOUNTER — Other Ambulatory Visit: Payer: Self-pay | Admitting: Cardiology

## 2012-01-08 NOTE — Telephone Encounter (Signed)
Fax Received. Refill Completed. Cynthia Jenkins (R.M.A)   

## 2012-01-27 ENCOUNTER — Encounter: Payer: Self-pay | Admitting: Cardiology

## 2012-01-27 ENCOUNTER — Ambulatory Visit (INDEPENDENT_AMBULATORY_CARE_PROVIDER_SITE_OTHER): Payer: Medicare Other | Admitting: Cardiology

## 2012-01-27 VITALS — BP 128/78 | HR 60 | Ht 67.5 in | Wt 169.0 lb

## 2012-01-27 DIAGNOSIS — I119 Hypertensive heart disease without heart failure: Secondary | ICD-10-CM

## 2012-01-27 DIAGNOSIS — E7800 Pure hypercholesterolemia, unspecified: Secondary | ICD-10-CM

## 2012-01-27 NOTE — Patient Instructions (Signed)
Your physician recommends that you continue on your current medications as directed. Please refer to the Current Medication list given to you today.  Your physician recommends that you schedule a follow-up appointment in: 4 months with fasting labs (lp/bmet/hfp)  

## 2012-01-27 NOTE — Progress Notes (Signed)
Cynthia Jenkins Date of Birth:  1941/04/06 [facility] [address] [place], [place]  [address] [phone]  Fax   [phone]  HPI: This pleasant 71 year old woman is seen back for a followup office visit.  We saw her 6 weeks ago at which time we cut back on her carvedilol because of dizziness.  She has felt better since then with no further dizziness.  Her blood pressure has been remaining stable on current dose of carvedilol which is 12.5 mg twice a day.  The patient has not been experiencing any chest pain or shortness of breath.  Several weeks ago she fell when her right hip gave out.  She saw her orthopedist and had x-rays and there was no problem with any fractures. Current Outpatient Prescriptions  Medication Sig Dispense Refill  . aspirin 81 MG tablet Take 81 mg by mouth daily.        . carvedilol (COREG) 25 MG tablet       . Cholecalciferol (VITAMIN D PO) Take by mouth daily.        . Cyanocobalamin (VITAMIN B 12 PO) Take by mouth daily.        Marland Kitchen losartan-hydrochlorothiazide (HYZAAR) 100-25 MG per tablet TAKE 1 TABLET DAILY (PT TO DISCONTINUE AVALIDE AND BYSTOLIC)  90 tablet  3  . Omega-3 Fatty Acids (FISH OIL PO) Take by mouth 2 (two) times daily.        . Omeprazole (PRILOSEC PO) Take by mouth daily.        . pravastatin (PRAVACHOL) 40 MG tablet TAKE 1 TABLET DAILY  90 tablet  3  . DISCONTD: carvedilol (COREG) 25 MG tablet TAKE 1 TABLET TWICE A DAY  180 tablet  4    Allergies  Allergen Reactions  . Aceon (Perindopril Erbumine)     cough  . Codeine   . Crestor (Rosuvastatin Calcium)     aching  . Diovan (Valsartan)     Dizzy,rash  . Norvasc (Amlodipine Besylate)   . Zetia (Ezetimibe)     cramps    Patient Active Problem List  Diagnosis  . Hypercholesterolemia  . Situational stress  . Stress fracture  . Benign hypertensive heart disease without heart failure    History  Smoking status  . Former Smoker  . Types: Cigarettes  Smokeless  tobacco  . Not on file    History  Alcohol Use     Family History  Problem Relation Age of Onset  . Heart disease Mother   . Diabetes Mother   . Emphysema Father   . Ovarian cancer Sister   . Coronary artery disease Brother     Review of Systems: The patient denies any heat or cold intolerance.  No weight gain or weight loss.  The patient denies headaches or blurry vision.  There is no cough or sputum production.  The patient denies dizziness.  There is no hematuria or hematochezia.  The patient denies any muscle aches or arthritis.  The patient denies any rash.  The patient denies frequent falling or instability.  There is no history of depression or anxiety.  All other systems were reviewed and are negative.   Physical Exam: Filed Vitals:   01/27/12 0931  BP: 128/78  Pulse: 60   the general appearance reveals a well-developed well-nourished woman in no distress.The head and neck exam reveals pupils equal and reactive.  Extraocular movements are full.  There is no scleral icterus.  The mouth and pharynx are normal.  The neck is supple.  The carotids reveal no bruits.  The jugular venous pressure is normal.  The  thyroid is not enlarged.  There is no lymphadenopathy.  The chest is clear to percussion and auscultation.  There are no rales or rhonchi.  Expansion of the chest is symmetrical.  The precordium is quiet.  The first heart sound is normal.  The second heart sound is physiologically split.  There is no murmur gallop rub or click.  There is no abnormal lift or heave.  The abdomen is soft and nontender.  The bowel sounds are normal.  The liver and spleen are not enlarged.  There are no abdominal masses.  There are no abdominal bruits.  Extremities reveal good pedal pulses.  There is no phlebitis or edema.  There is no cyanosis or clubbing.  Strength is normal and symmetrical in all extremities.  There is no lateralizing weakness.  There are no sensory deficits.  The skin is warm and  dry.  There is no rash.      Assessment / Plan:  Blood pressure is now stable on the lower dose of carvedilol and she is no longer having dizzy spells.  Recheck in 4 months for followup office visit lipid panel hepatic function panel and basal metabolic panel.  Continue present diet and weight loss.

## 2012-01-27 NOTE — Assessment & Plan Note (Signed)
The patient has not been experiencing any dizzy spells.  Denies any chest pain or shortness of breath.  She remains physically active.  Her weight is up 2 pounds since last visit.  She remains on a low cholesterol diet and is on pravastatin and fish oil

## 2012-04-15 ENCOUNTER — Other Ambulatory Visit: Payer: Self-pay | Admitting: Dermatology

## 2012-05-20 ENCOUNTER — Ambulatory Visit (INDEPENDENT_AMBULATORY_CARE_PROVIDER_SITE_OTHER): Payer: Medicare Other | Admitting: Cardiology

## 2012-05-20 ENCOUNTER — Other Ambulatory Visit: Payer: Medicare Other

## 2012-05-20 ENCOUNTER — Encounter: Payer: Self-pay | Admitting: Cardiology

## 2012-05-20 VITALS — BP 133/73 | HR 57 | Ht 68.4 in | Wt 171.0 lb

## 2012-05-20 DIAGNOSIS — E7800 Pure hypercholesterolemia, unspecified: Secondary | ICD-10-CM

## 2012-05-20 DIAGNOSIS — I119 Hypertensive heart disease without heart failure: Secondary | ICD-10-CM

## 2012-05-20 MED ORDER — GUAIFENESIN ER 600 MG PO TB12: 600.0000 mg | ORAL_TABLET | Freq: Two times a day (BID) | ORAL | Status: DC | PRN

## 2012-05-20 MED ORDER — CARVEDILOL 25 MG PO TABS: 12.5000 mg | ORAL_TABLET | Freq: Two times a day (BID) | ORAL | Status: DC

## 2012-05-20 NOTE — Progress Notes (Signed)
Cynthia Jenkins Date of Birth:  [birth year] [facility] [address] [place], [place]  [address] [phone]  Fax   [phone]  HPI: This pleasant 71 year old woman is seen back for a followup office visit.  She has a past history of hypercholesterolemia and essential hypertension.  Since last visit she has been feeling well with no new cardiac symptoms.  She has had some problems with sinusitis and she saw her ENT physician Dr. Ezzard Standing about 6 weeks ago.  She has had some residual sinus headaches.   Current Outpatient Prescriptions  Medication Sig Dispense Refill  . aspirin 81 MG tablet Take 81 mg by mouth daily.        . carvedilol (COREG) 25 MG tablet Take 0.5 tablets (12.5 mg total) by mouth 2 (two) times daily with a meal.      . Cholecalciferol (VITAMIN D PO) Take by mouth daily.        . Cyanocobalamin (VITAMIN B 12 PO) Take by mouth daily.        Marland Kitchen losartan-hydrochlorothiazide (HYZAAR) 100-25 MG per tablet TAKE 1 TABLET DAILY (PT TO DISCONTINUE AVALIDE AND BYSTOLIC)  90 tablet  3  . Omega-3 Fatty Acids (FISH OIL PO) Take by mouth 2 (two) times daily.        . Omeprazole (PRILOSEC PO) Take by mouth daily.        . pravastatin (PRAVACHOL) 40 MG tablet TAKE 1 TABLET DAILY  90 tablet  3  . [DISCONTINUED] carvedilol (COREG) 25 MG tablet       . guaiFENesin (MUCINEX) 600 MG 12 hr tablet Take 1 tablet (600 mg total) by mouth 2 (two) times daily as needed for congestion.        Allergies  Allergen Reactions  . Aceon (Perindopril Erbumine)     cough  . Codeine   . Crestor (Rosuvastatin Calcium)     aching  . Diovan (Valsartan)     Dizzy,rash  . Norvasc (Amlodipine Besylate)   . Zetia (Ezetimibe)     cramps    Patient Active Problem List  Diagnosis  . Hypercholesterolemia  . Situational stress  . Stress fracture  . Benign hypertensive heart disease without heart failure    History  Smoking status  . Former Smoker  . Types: Cigarettes  Smokeless  tobacco  . Not on file    History  Alcohol Use     Family History  Problem Relation Age of Onset  . Heart disease Mother   . Diabetes Mother   . Emphysema Father   . Ovarian cancer Sister   . Coronary artery disease Brother     Review of Systems: The patient denies any heat or cold intolerance.  No weight gain or weight loss.  The patient denies headaches or blurry vision.  There is no cough or sputum production.  The patient denies dizziness.  There is no hematuria or hematochezia.  The patient denies any muscle aches or arthritis.  The patient denies any rash.  The patient denies frequent falling or instability.  There is no history of depression or anxiety.  All other systems were reviewed and are negative.   Physical Exam: Filed Vitals:   05/20/12 0929  BP: 133/73  Pulse: 57   the general appearance reveals a well-developed well-nourished woman in no distress.The head and neck exam reveals pupils equal and reactive.  Extraocular movements are full.  There is no scleral icterus.  The mouth and pharynx are normal.  The neck is supple.  The carotids reveal no bruits.  The jugular venous pressure is normal.  The  thyroid is not enlarged.  There is no lymphadenopathy.  The chest is clear to percussion and auscultation.  There are no rales or rhonchi.  Expansion of the chest is symmetrical.  The precordium is quiet.  The first heart sound is normal.  The second heart sound is physiologically split.  There is no murmur gallop rub or click.  There is no abnormal lift or heave.  The abdomen is soft and nontender.  The bowel sounds are normal.  The liver and spleen are not enlarged.  There are no abdominal masses.  There are no abdominal bruits.  Extremities reveal good pedal pulses.  There is no phlebitis or edema.  There is no cyanosis or clubbing.  Strength is normal and symmetrical in all extremities.  There is no lateralizing weakness.  There are no sensory deficits.  The skin is warm and  dry.  There is no rash.     Assessment / Plan: Continue same medication.  Use Mucinex for residual sinusitis headache.  Recheck in 4 months for office visit and fasting lab work

## 2012-05-20 NOTE — Assessment & Plan Note (Signed)
The patient has hypercholesterolemia and is on statin 40 mg daily.  We're checking lab work today.  She has not been walking is much and her weight is up 2 pounds since last visit.

## 2012-05-20 NOTE — Assessment & Plan Note (Signed)
The patient has not been expressing any chest pain.  Her pressure has been stable on current therapy.  She now takes carvedilol 25 mg tablets one half tablet twice a day and is not having any dizziness with this dose.

## 2012-05-20 NOTE — Patient Instructions (Signed)
Purchase over the counter Mucinex 600 mg twice a day   Your physician recommends that you schedule a follow-up appointment in: 4 months    Your physician recommends that you return for a FASTING lipid profile: today and in 4 months

## 2012-06-17 ENCOUNTER — Encounter: Payer: Self-pay | Admitting: Cardiology

## 2012-07-27 ENCOUNTER — Other Ambulatory Visit: Payer: Self-pay | Admitting: Internal Medicine

## 2012-07-27 ENCOUNTER — Telehealth: Payer: Self-pay | Admitting: Cardiology

## 2012-07-27 DIAGNOSIS — Z1231 Encounter for screening mammogram for malignant neoplasm of breast: Secondary | ICD-10-CM

## 2012-07-27 NOTE — Telephone Encounter (Signed)
New Problem:    Patient would like for you to give her a call back because her BP is up.  Please call back.

## 2012-07-27 NOTE — Telephone Encounter (Signed)
Patients took her blood pressure this am and it was 161/79 and after lunch it was 152/81. Patient does not check her blood pressure daily. Has not checked blood pressure since her office visit in November. Did advise to decrease her salt intake and drink plenty of water. Patient does want  Dr. Patty Sermons to make decision on medications. Will call her in am for blood pressure numbers (will check tonight and in am)

## 2012-07-28 NOTE — Telephone Encounter (Signed)
Am today 140's systolic before medication and 140's this afternoon.  Will continue to monitor and will call her back Thursday after 1:00

## 2012-07-28 NOTE — Telephone Encounter (Signed)
Pt rtn call to [place] at [phone]

## 2012-08-31 ENCOUNTER — Ambulatory Visit
Admission: RE | Admit: 2012-08-31 | Discharge: 2012-08-31 | Disposition: A | Discharge status: Home / Self Care | Payer: Medicare Other | Source: Ambulatory Visit | Attending: Internal Medicine | Admitting: Internal Medicine

## 2012-09-17 ENCOUNTER — Other Ambulatory Visit: Payer: Self-pay | Admitting: *Deleted

## 2012-09-17 MED ORDER — LOSARTAN POTASSIUM-HCTZ 100-25 MG PO TABS: 1.0000 | ORAL_TABLET | Freq: Every day | ORAL | Status: DC

## 2012-09-18 ENCOUNTER — Ambulatory Visit (INDEPENDENT_AMBULATORY_CARE_PROVIDER_SITE_OTHER): Payer: Medicare Other | Admitting: Cardiology

## 2012-09-18 ENCOUNTER — Encounter: Payer: Self-pay | Admitting: Cardiology

## 2012-09-18 VITALS — BP 126/70 | HR 57 | Ht 68.4 in | Wt 177.8 lb

## 2012-09-18 DIAGNOSIS — E7800 Pure hypercholesterolemia, unspecified: Secondary | ICD-10-CM

## 2012-09-18 DIAGNOSIS — I119 Hypertensive heart disease without heart failure: Secondary | ICD-10-CM

## 2012-09-18 LAB — HEPATIC FUNCTION PANEL
ALT: 38 U/L — ABNORMAL HIGH (ref 0–35)
Alkaline Phosphatase: 54 U/L (ref 39–117)
Bilirubin, Direct: 0.1 mg/dL (ref 0.0–0.3)
Total Bilirubin: 1.3 mg/dL — ABNORMAL HIGH (ref 0.3–1.2)
Total Protein: 6.9 g/dL (ref 6.0–8.3)

## 2012-09-18 LAB — BASIC METABOLIC PANEL
BUN: 19 mg/dL (ref 6–23)
Chloride: 104 mEq/L (ref 96–112)
Creatinine, Ser: 1 mg/dL (ref 0.4–1.2)
GFR: 57.1 mL/min — ABNORMAL LOW (ref >60.00)
Glucose, Bld: 108 mg/dL — ABNORMAL HIGH (ref 70–99)

## 2012-09-18 LAB — LIPID PANEL: Cholesterol: 162 mg/dL (ref 0–200)

## 2012-09-18 NOTE — Assessment & Plan Note (Signed)
Blood pressure has been remaining stable on current therapy.  She's not having any chest pain or shortness of breath.  She has been going to the gym at [facility] on an almost daily basis and working out on the [employer].

## 2012-09-18 NOTE — Patient Instructions (Signed)

## 2012-09-18 NOTE — Assessment & Plan Note (Signed)
She has a history of hypercholesterolemia and is on pravastatin 40 mg daily.  Her weight is up 6 pounds over the winter.  We are checking lab work today.  She will be working hard to get her weight off

## 2012-09-18 NOTE — Progress Notes (Signed)
Quick Note:  Please report to patient. The recent labs are stable. Continue same medication and careful diet. ______ 

## 2012-09-18 NOTE — Telephone Encounter (Signed)
Patient never called back, seen today for ov

## 2012-09-18 NOTE — Progress Notes (Signed)
Cynthia Jenkins Date of Birth:  [birth year] [facility] [address] [place], [place]  [address] [phone]  Fax   [phone]  HPI: This pleasant 72 year old woman is seen back for a followup office visit. She has a past history of hypercholesterolemia and essential hypertension. Since last visit she has been feeling well with no new cardiac symptoms.  She does not have any history of ischemic heart disease.  She did have some dizzy spells related to previous right ear infection with fluid in the ear and her PCP has given her an antibiotic with improvement.   Current Outpatient Prescriptions  Medication Sig Dispense Refill  . aspirin 81 MG tablet Take 81 mg by mouth daily.        . carvedilol (COREG) 25 MG tablet Take 0.5 tablets (12.5 mg total) by mouth 2 (two) times daily with a meal.      . Cholecalciferol (VITAMIN D PO) Take by mouth daily.        . Cyanocobalamin (VITAMIN B 12 PO) Take by mouth daily.        Marland Kitchen guaiFENesin (MUCINEX) 600 MG 12 hr tablet Take 1 tablet (600 mg total) by mouth 2 (two) times daily as needed for congestion.      Marland Kitchen losartan-hydrochlorothiazide (HYZAAR) 100-25 MG per tablet Take 1 tablet by mouth daily.  90 tablet  3  . Omega-3 Fatty Acids (FISH OIL PO) Take by mouth 2 (two) times daily.        . Omeprazole (PRILOSEC PO) Take by mouth daily.        . pravastatin (PRAVACHOL) 40 MG tablet TAKE 1 TABLET DAILY  90 tablet  3   No current facility-administered medications for this visit.    Allergies  Allergen Reactions  . Aceon (Perindopril Erbumine)     cough  . Codeine   . Crestor (Rosuvastatin Calcium)     aching  . Diovan (Valsartan)     Dizzy,rash  . Norvasc (Amlodipine Besylate)   . Zetia (Ezetimibe)     cramps    Patient Active Problem List  Diagnosis  . Hypercholesterolemia  . Situational stress  . Stress fracture  . Benign hypertensive heart disease without heart failure    History  Smoking status  . Former  Smoker  . Types: Cigarettes  Smokeless tobacco  . Not on file    History  Alcohol Use     Family History  Problem Relation Age of Onset  . Heart disease Mother   . Diabetes Mother   . Emphysema Father   . Ovarian cancer Sister   . Coronary artery disease Brother     Review of Systems: The patient denies any heat or cold intolerance.  No weight gain or weight loss.  The patient denies headaches or blurry vision.  There is no cough or sputum production.  The patient denies dizziness.  There is no hematuria or hematochezia.  The patient denies any muscle aches or arthritis.  The patient denies any rash.  The patient denies frequent falling or instability.  There is no history of depression or anxiety.  All other systems were reviewed and are negative.   Physical Exam: Filed Vitals:   09/18/12 0906  BP: 126/70  Pulse: 57   the general appearance reveals a well-developed well-nourished woman in no distress.The head and neck exam reveals pupils equal and reactive.  Extraocular movements are full.  There is no scleral icterus.  I checked her ears and there  is no evidence of any otitis media. The mouth and pharynx are normal.  The neck is supple.  The carotids reveal no bruits.  The jugular venous pressure is normal.  The  thyroid is not enlarged.  There is no lymphadenopathy.  The chest is clear to percussion and auscultation.  There are no rales or rhonchi.  Expansion of the chest is symmetrical.  The precordium is quiet.  The first heart sound is normal.  The second heart sound is physiologically split.  There is no murmur gallop rub or click.  There is no abnormal lift or heave.  The abdomen is soft and nontender.  The bowel sounds are normal.  The liver and spleen are not enlarged.  There are no abdominal masses.  There are no abdominal bruits.  Extremities reveal good pedal pulses.  There is no phlebitis or edema.  There is no cyanosis or clubbing.  Strength is normal and symmetrical in all  extremities.  There is no lateralizing weakness.  There are no sensory deficits.  The skin is warm and dry.  There is no rash.      Assessment / Plan: Continue same medication.  Lab work today pending.  Work harder on weight loss.  Recheck in 4 months for office visit lipid panel hepatic function panel and basal metabolic panel.

## 2012-09-21 ENCOUNTER — Telehealth: Payer: Self-pay | Admitting: *Deleted

## 2012-09-21 NOTE — Telephone Encounter (Signed)
Message copied by Burnell Blanks on Mon Sep 21, 2012  4:42 PM ------      Message from: Cassell Clement      Created: Fri Sep 18, 2012  6:24 PM       Please report to patient.  The recent labs are stable. Continue same medication and careful diet. ------

## 2012-09-21 NOTE — Telephone Encounter (Signed)
Advised patient of lab results  

## 2012-10-21 ENCOUNTER — Other Ambulatory Visit: Payer: Self-pay | Admitting: *Deleted

## 2012-10-21 MED ORDER — PRAVASTATIN SODIUM 40 MG PO TABS: 40.0000 mg | ORAL_TABLET | Freq: Every day | ORAL | Status: DC

## 2013-01-25 ENCOUNTER — Other Ambulatory Visit: Payer: Medicare Other

## 2013-01-25 ENCOUNTER — Ambulatory Visit (INDEPENDENT_AMBULATORY_CARE_PROVIDER_SITE_OTHER): Payer: Medicare Other | Admitting: Cardiology

## 2013-01-25 ENCOUNTER — Encounter: Payer: Self-pay | Admitting: Cardiology

## 2013-01-25 VITALS — BP 118/62 | HR 58 | Ht 68.4 in | Wt 183.0 lb

## 2013-01-25 DIAGNOSIS — I119 Hypertensive heart disease without heart failure: Secondary | ICD-10-CM

## 2013-01-25 DIAGNOSIS — E785 Hyperlipidemia, unspecified: Secondary | ICD-10-CM

## 2013-01-25 DIAGNOSIS — E7800 Pure hypercholesterolemia, unspecified: Secondary | ICD-10-CM

## 2013-01-25 MED ORDER — CARVEDILOL 12.5 MG PO TABS: 12.5000 mg | ORAL_TABLET | Freq: Two times a day (BID) | ORAL | Status: DC

## 2013-01-25 NOTE — Patient Instructions (Signed)
Your physician recommends that you return for a FASTING lipid profile: Thursday  Your physician wants you to follow-up in: 4 MONTHS  You will receive a reminder letter in the mail two months in advance. If you don't receive a letter, please call our office to schedule the follow-up appointment.

## 2013-01-25 NOTE — Assessment & Plan Note (Signed)
Her blood pressure is staying in a good therapeutic range on current medicine.  She is not having any dizziness or syncope or palpitations.  No chest pain.  She is exercising at a gym

## 2013-01-25 NOTE — Assessment & Plan Note (Signed)
We were unable to draw her blood work today.  She will return soon for lab work.  Since last visit she has gained 6 pounds.  She will work harder on portion control and losing weight.

## 2013-01-25 NOTE — Progress Notes (Signed)
Cynthia Jenkins Date of Birth:  12-08-1940 [facility] [address] [place], [place]  [address] [phone]  Fax   [phone]  HPI: This pleasant 72 year old woman is seen back for a followup office visit. She has a past history of hypercholesterolemia and essential hypertension. Since last visit she has been feeling well with no new cardiac symptoms. She does not have any history of ischemic heart disease.   Current Outpatient Prescriptions  Medication Sig Dispense Refill  . aspirin 81 MG tablet Take 81 mg by mouth daily.        . carvedilol (COREG) 12.5 MG tablet Take 1 tablet (12.5 mg total) by mouth 2 (two) times daily with a meal.  180 tablet  3  . Cholecalciferol (VITAMIN D PO) Take by mouth daily.        . Cyanocobalamin (VITAMIN B 12 PO) Take by mouth daily.        Marland Kitchen guaiFENesin (MUCINEX) 600 MG 12 hr tablet Take 1 tablet (600 mg total) by mouth 2 (two) times daily as needed for congestion.      Marland Kitchen losartan-hydrochlorothiazide (HYZAAR) 100-25 MG per tablet Take 1 tablet by mouth daily.  90 tablet  3  . Omega-3 Fatty Acids (FISH OIL PO) Take by mouth 2 (two) times daily.        . Omeprazole (PRILOSEC PO) Take by mouth daily.        . pravastatin (PRAVACHOL) 40 MG tablet Take 1 tablet (40 mg total) by mouth daily.  90 tablet  3   No current facility-administered medications for this visit.    Allergies  Allergen Reactions  . Aceon (Perindopril Erbumine)     cough  . Codeine   . Crestor (Rosuvastatin Calcium)     aching  . Diovan (Valsartan)     Dizzy,rash  . Norvasc (Amlodipine Besylate)   . Zetia (Ezetimibe)     cramps    Patient Active Problem List   Diagnosis Date Noted  . Benign hypertensive heart disease without heart failure 11/29/2010    Priority: High  . Hypercholesterolemia     Priority: High  . Situational stress   . Stress fracture     History  Smoking status  . Former Smoker  . Types: Cigarettes  Smokeless tobacco  . Not  on file    History  Alcohol Use No    Family History  Problem Relation Age of Onset  . Heart disease Mother   . Diabetes Mother   . Emphysema Father   . Ovarian cancer Sister   . Coronary artery disease Brother     Review of Systems: The patient denies any heat or cold intolerance.  No weight gain or weight loss.  The patient denies headaches or blurry vision.  There is no cough or sputum production.  The patient denies dizziness.  There is no hematuria or hematochezia.  The patient denies any muscle aches or arthritis.  The patient denies any rash.  The patient denies frequent falling or instability.  There is no history of depression or anxiety.  All other systems were reviewed and are negative.   Physical Exam: Filed Vitals:   01/25/13 1158  BP: 118/62  Pulse: 58   the general appearance reveals a well-developed well-nourished woman in no distress.The head and neck exam reveals pupils equal and reactive.  Extraocular movements are full.  There is no scleral icterus.  The mouth and pharynx are normal.  The neck is supple.  The carotids reveal no bruits.  The jugular venous pressure is normal.  The  thyroid is not enlarged.  There is no lymphadenopathy.  The chest is clear to percussion and auscultation.  There are no rales or rhonchi.  Expansion of the chest is symmetrical.  The precordium is quiet.  The first heart sound is normal.  The second heart sound is physiologically split.  There is no murmur gallop rub or click.  There is no abnormal lift or heave.  The abdomen is soft and nontender.  The bowel sounds are normal.  The liver and spleen are not enlarged.  There are no abdominal masses.  There are no abdominal bruits.  Extremities reveal good pedal pulses.  There is no phlebitis or edema.  There is no cyanosis or clubbing.  Strength is normal and symmetrical in all extremities.  There is no lateralizing weakness.  There are no sensory deficits.  The skin is warm and dry.  There is  no rash.      Assessment / Plan: Continue same medication.  We're switching her carvedilol to 12.5 mg tablets and she will take 1 twice a day.  Work harder on weight loss.  Recheck in 4 months for followup office visit and lab work.

## 2013-01-28 ENCOUNTER — Other Ambulatory Visit (INDEPENDENT_AMBULATORY_CARE_PROVIDER_SITE_OTHER): Payer: Medicare Other

## 2013-01-28 DIAGNOSIS — E785 Hyperlipidemia, unspecified: Secondary | ICD-10-CM

## 2013-01-28 LAB — BASIC METABOLIC PANEL
Chloride: 105 mEq/L (ref 96–112)
Potassium: 4 mEq/L (ref 3.5–5.1)

## 2013-01-28 LAB — LIPID PANEL
Cholesterol: 169 mg/dL (ref 0–200)
LDL Cholesterol: 93 mg/dL (ref 0–99)
Triglycerides: 96 mg/dL (ref 0.0–149.0)
VLDL: 19.2 mg/dL (ref 0.0–40.0)

## 2013-01-28 LAB — HEPATIC FUNCTION PANEL
Albumin: 3.5 g/dL (ref 3.5–5.2)
Total Protein: 6.5 g/dL (ref 6.0–8.3)

## 2013-01-28 NOTE — Progress Notes (Signed)
Quick Note:  Please report to patient. The recent labs are stable. Continue same medication and careful diet. ______ 

## 2013-01-29 ENCOUNTER — Telehealth: Payer: Self-pay | Admitting: *Deleted

## 2013-01-29 NOTE — Telephone Encounter (Signed)
Message copied by Burnell Blanks on Fri Jan 29, 2013  8:51 AM ------      Message from: Cassell Clement      Created: Thu Jan 28, 2013  8:47 PM       Please report to patient.  The recent labs are stable. Continue same medication and careful diet. ------

## 2013-01-29 NOTE — Telephone Encounter (Signed)
Advised patient of lab results  

## 2013-02-02 ENCOUNTER — Encounter: Payer: Self-pay | Admitting: Cardiology

## 2013-06-02 ENCOUNTER — Ambulatory Visit (INDEPENDENT_AMBULATORY_CARE_PROVIDER_SITE_OTHER): Payer: Medicare Other | Admitting: Cardiology

## 2013-06-02 ENCOUNTER — Other Ambulatory Visit (INDEPENDENT_AMBULATORY_CARE_PROVIDER_SITE_OTHER): Payer: Medicare Other

## 2013-06-02 ENCOUNTER — Encounter: Payer: Self-pay | Admitting: Cardiology

## 2013-06-02 VITALS — BP 136/66 | HR 62 | Ht 68.5 in | Wt 184.0 lb

## 2013-06-02 DIAGNOSIS — E7800 Pure hypercholesterolemia, unspecified: Secondary | ICD-10-CM

## 2013-06-02 DIAGNOSIS — Z733 Stress, not elsewhere classified: Secondary | ICD-10-CM

## 2013-06-02 DIAGNOSIS — I119 Hypertensive heart disease without heart failure: Secondary | ICD-10-CM

## 2013-06-02 DIAGNOSIS — F439 Reaction to severe stress, unspecified: Secondary | ICD-10-CM

## 2013-06-02 LAB — BASIC METABOLIC PANEL
BUN: 16 mg/dL (ref 6–23)
CO2: 28 mEq/L (ref 19–32)
Chloride: 105 mEq/L (ref 96–112)
Creatinine, Ser: 1 mg/dL (ref 0.4–1.2)
Glucose, Bld: 110 mg/dL — ABNORMAL HIGH (ref 70–99)

## 2013-06-02 LAB — LIPID PANEL
Cholesterol: 159 mg/dL (ref 0–200)
LDL Cholesterol: 91 mg/dL (ref 0–99)
Total CHOL/HDL Ratio: 3

## 2013-06-02 LAB — HEPATIC FUNCTION PANEL
Alkaline Phosphatase: 63 U/L (ref 39–117)
Bilirubin, Direct: 0.1 mg/dL (ref 0.0–0.3)

## 2013-06-02 NOTE — Assessment & Plan Note (Signed)
Patient has a history of hypercholesterolemia and is on pravastatin 40 mg daily.  Blood work was drawn today and is pending.  She is not having any myalgias from the pravastatin

## 2013-06-02 NOTE — Progress Notes (Signed)
Quick Note:  Please report to patient. The recent labs are stable. Continue same medication and careful diet. ______ 

## 2013-06-02 NOTE — Patient Instructions (Signed)
Your physician wants you to FOLLOW UP & HAVE FASTING LAB WORK (LIPID,LIVER,BMET)  in: 4 MONTHS .    Your physician recommends that you continue on your current medications as directed. Please refer to the Current Medication list given to you today.

## 2013-06-02 NOTE — Assessment & Plan Note (Signed)
No headaches or dizzy spells.  Blood pressure has been remaining stable

## 2013-06-02 NOTE — Progress Notes (Signed)
Cynthia Jenkins Date of Birth:  [birth year] [address] [place], [place]  [address] [phone]  Fax   [phone]  HPI: This pleasant 72 year old woman is seen back for a followup office visit. She has a past history of hypercholesterolemia and essential hypertension. Since last visit she has been feeling well with no new cardiac symptoms.  She does not have any history of ischemic heart disease.  She has developed some fever blisters over the past week.  She is using some over-the-counter salve.  Generally she exercises on a regular basis.  However her workout centers under reconstruction so she has not been able to go there for the past 2 weeks.  She has been complaining of some malaise and fatigue.  Current Outpatient Prescriptions  Medication Sig Dispense Refill  . aspirin 81 MG tablet Take 81 mg by mouth daily.        . carvedilol (COREG) 12.5 MG tablet Take 1 tablet (12.5 mg total) by mouth 2 (two) times daily with a meal.  180 tablet  3  . Cholecalciferol (VITAMIN D PO) Take by mouth daily.        . Cyanocobalamin (VITAMIN B 12 PO) Take by mouth daily.        Marland Kitchen guaiFENesin (MUCINEX) 600 MG 12 hr tablet Take 1 tablet (600 mg total) by mouth 2 (two) times daily as needed for congestion.      Marland Kitchen losartan-hydrochlorothiazide (HYZAAR) 100-25 MG per tablet Take 1 tablet by mouth daily.  90 tablet  3  . Omega-3 Fatty Acids (FISH OIL PO) Take by mouth 2 (two) times daily.        . Omeprazole (PRILOSEC PO) Take by mouth daily.        . pravastatin (PRAVACHOL) 40 MG tablet Take 1 tablet (40 mg total) by mouth daily.  90 tablet  3   No current facility-administered medications for this visit.    Allergies  Allergen Reactions  . Aceon [Perindopril Erbumine]     cough  . Codeine   . Crestor [Rosuvastatin Calcium]     aching  . Diovan [Valsartan]     Dizzy,rash  . Norvasc [Amlodipine Besylate]   . Zetia [Ezetimibe]     cramps    Patient Active Problem List   Diagnosis Date Noted  . Benign hypertensive heart disease without heart failure 11/29/2010    Priority: High  . Hypercholesterolemia     Priority: High  . Situational stress   . Stress fracture     History  Smoking status  . Former Smoker  . Types: Cigarettes  Smokeless tobacco  . Not on file    History  Alcohol Use No    Family History  Problem Relation Age of Onset  . Heart disease Mother   . Diabetes Mother   . Emphysema Father   . Ovarian cancer Sister   . Coronary artery disease Brother     Review of Systems: The patient denies any heat or cold intolerance.  No weight gain or weight loss.  The patient denies headaches or blurry vision.  There is no cough or sputum production.  The patient denies dizziness.  There is no hematuria or hematochezia.  The patient denies any muscle aches or arthritis.  The patient denies any rash.  The patient denies frequent falling or instability.  There is no history of depression or anxiety.  All other systems were reviewed and are negative.   Physical Exam: Filed Vitals:  06/02/13 0912  BP: 136/66  Pulse: 62   the general appearance reveals a well-developed well-nourished woman in no distress.The head and neck exam reveals pupils equal and reactive.  Extraocular movements are full.  There is no scleral icterus.  I checked her ears and there is no evidence of any otitis media. The mouth and pharynx are normal.  The neck is supple.  The carotids reveal no bruits.  The jugular venous pressure is normal.  The  thyroid is not enlarged.  There is no lymphadenopathy.  The chest is clear to percussion and auscultation.  There are no rales or rhonchi.  Expansion of the chest is symmetrical.  The precordium is quiet.  The first heart sound is normal.  The second heart sound is physiologically split.  There is no murmur gallop rub or click.  There is no abnormal lift or heave.  The abdomen is soft and nontender.  The bowel sounds are normal.  The  liver and spleen are not enlarged.  There are no abdominal masses.  There are no abdominal bruits.  Extremities reveal good pedal pulses.  There is no phlebitis or edema.  There is no cyanosis or clubbing.  Strength is normal and symmetrical in all extremities.  There is no lateralizing weakness.  There are no sensory deficits.  The skin is warm and dry.  There is no rash.      Assessment / Plan: Continue same medication.  Lab work today pending.  Work harder on weight loss.  Recheck in 4 months for office visit lipid panel hepatic function panel and basal metabolic panel.  She has been complaining of some malaise and fatigue.  She has a complete physical scheduled with her PCP soon and she will discuss this further at that time.  She may need to have a CBC and a TSH drawn at that time.

## 2013-06-22 ENCOUNTER — Encounter: Payer: Self-pay | Admitting: Cardiology

## 2013-07-27 ENCOUNTER — Other Ambulatory Visit: Payer: Self-pay

## 2013-07-27 DIAGNOSIS — Z1231 Encounter for screening mammogram for malignant neoplasm of breast: Secondary | ICD-10-CM

## 2013-07-31 ENCOUNTER — Other Ambulatory Visit: Payer: Self-pay | Admitting: Cardiology

## 2013-09-07 ENCOUNTER — Ambulatory Visit
Admission: RE | Admit: 2013-09-07 | Discharge: 2013-09-07 | Disposition: A | Discharge status: Home / Self Care | Payer: Self-pay | Source: Ambulatory Visit

## 2013-09-07 DIAGNOSIS — Z1231 Encounter for screening mammogram for malignant neoplasm of breast: Secondary | ICD-10-CM

## 2013-09-18 ENCOUNTER — Other Ambulatory Visit: Payer: Self-pay | Admitting: Cardiology

## 2013-09-29 ENCOUNTER — Other Ambulatory Visit: Payer: Medicare Other

## 2013-09-29 ENCOUNTER — Encounter: Payer: Self-pay | Admitting: Cardiology

## 2013-09-29 ENCOUNTER — Ambulatory Visit (INDEPENDENT_AMBULATORY_CARE_PROVIDER_SITE_OTHER): Payer: Medicare Other | Admitting: Cardiology

## 2013-09-29 VITALS — BP 118/68 | HR 59 | Ht 68.5 in | Wt 178.0 lb

## 2013-09-29 DIAGNOSIS — I119 Hypertensive heart disease without heart failure: Secondary | ICD-10-CM

## 2013-09-29 DIAGNOSIS — F439 Reaction to severe stress, unspecified: Secondary | ICD-10-CM

## 2013-09-29 DIAGNOSIS — E7800 Pure hypercholesterolemia, unspecified: Secondary | ICD-10-CM

## 2013-09-29 DIAGNOSIS — Z733 Stress, not elsewhere classified: Secondary | ICD-10-CM

## 2013-09-29 LAB — HEPATIC FUNCTION PANEL
ALK PHOS: 60 U/L (ref 39–117)
ALT: 45 U/L — ABNORMAL HIGH (ref 0–35)
AST: 35 U/L (ref 0–37)
Albumin: 3.8 g/dL (ref 3.5–5.2)
BILIRUBIN DIRECT: 0.2 mg/dL (ref 0.0–0.3)
BILIRUBIN TOTAL: 0.8 mg/dL (ref 0.3–1.2)
Total Protein: 7.1 g/dL (ref 6.0–8.3)

## 2013-09-29 LAB — BASIC METABOLIC PANEL
BUN: 16 mg/dL (ref 6–23)
CHLORIDE: 104 meq/L (ref 96–112)
CO2: 27 mEq/L (ref 19–32)
Calcium: 9.5 mg/dL (ref 8.4–10.5)
Creatinine, Ser: 1 mg/dL (ref 0.4–1.2)
GFR: 59.66 mL/min — ABNORMAL LOW (ref >60.00)
GLUCOSE: 117 mg/dL — AB (ref 70–99)
POTASSIUM: 3.8 meq/L (ref 3.5–5.1)
Sodium: 140 mEq/L (ref 135–145)

## 2013-09-29 LAB — LIPID PANEL
CHOL/HDL RATIO: 3
Cholesterol: 168 mg/dL (ref 0–200)
HDL: 48.2 mg/dL (ref >39.00)
LDL CALC: 95 mg/dL (ref 0–99)
Triglycerides: 124 mg/dL (ref 0.0–149.0)
VLDL: 24.8 mg/dL (ref 0.0–40.0)

## 2013-09-29 NOTE — Assessment & Plan Note (Signed)
Patient has a history of hypercholesterolemia.  She is tolerating Pravachol.  She has cut back on her fish oil to just one daily.  Blood work today is pending.

## 2013-09-29 NOTE — Assessment & Plan Note (Signed)
Blood pressure is doing well on current regimen.  She has not had any dizziness or syncope.  No symptoms of CHF

## 2013-09-29 NOTE — Progress Notes (Signed)
Cynthia HumphreyShirley A Jenkins Date of Birth:  05/12/1941 [address] [place], [place]  [id number] [phone]  Fax   [phone]  HPI: This pleasant 73 year old woman is seen back for a followup office visit. She has a past history of hypercholesterolemia and essential hypertension. Since last visit she has been feeling well with no new cardiac symptoms.  She does not have any history of ischemic heart disease.  She has developed some fever blisters over the past week.  She is using some over-the-counter salve.  Generally she exercises on a regular basis.  She is scheduled for a routine colonoscopy with Dr.Ganem next week. She has a strong family history of heart and other problems.  Her mother died at 8768 of diabetes.  Her father died at 2672 of emphysema from smoking.  She had a sister who died of ovarian cancer at age 74 and a brother who died of heart trouble at age 74. Current Outpatient Prescriptions  Medication Sig Dispense Refill  . aspirin 81 MG tablet Take 81 mg by mouth daily.        . carvedilol (COREG) 12.5 MG tablet Take 1 tablet (12.5 mg total) by mouth 2 (two) times daily with a meal.  180 tablet  3  . Cholecalciferol (VITAMIN D PO) Take by mouth daily.        . Cyanocobalamin (VITAMIN B 12 PO) Take by mouth daily.        Marland Kitchen. guaiFENesin (MUCINEX) 600 MG 12 hr tablet Take 1 tablet (600 mg total) by mouth 2 (two) times daily as needed for congestion.      Marland Kitchen. losartan-hydrochlorothiazide (HYZAAR) 100-25 MG per tablet TAKE 1 TABLET DAILY  90 tablet  0  . Omega-3 Fatty Acids (FISH OIL PO) Take by mouth daily.       . Omeprazole (PRILOSEC PO) Take by mouth daily.        . pravastatin (PRAVACHOL) 40 MG tablet TAKE 1 TABLET DAILY  90 tablet  2   No current facility-administered medications for this visit.    Allergies  Allergen Reactions  . Aceon [Perindopril Erbumine]     cough  . Codeine   . Crestor [Rosuvastatin Calcium]     aching  . Diovan [Valsartan]     Dizzy,rash    . Norvasc [Amlodipine Besylate]   . Zetia [Ezetimibe]     cramps    Patient Active Problem List   Diagnosis Date Noted  . Benign hypertensive heart disease without heart failure 11/29/2010    Priority: High  . Hypercholesterolemia     Priority: High  . Situational stress   . Stress fracture     History  Smoking status  . Former Smoker  . Types: Cigarettes  Smokeless tobacco  . Not on file    History  Alcohol Use No    Family History  Problem Relation Age of Onset  . Heart disease Mother   . Diabetes Mother   . Emphysema Father   . Ovarian cancer Sister   . Coronary artery disease Brother     Review of Systems: The patient denies any heat or cold intolerance.  No weight gain or weight loss.  The patient denies headaches or blurry vision.  There is no cough or sputum production.  The patient denies dizziness.  There is no hematuria or hematochezia.  The patient denies any muscle aches or arthritis.  The patient denies any rash.  The patient denies frequent falling or  instability.  There is no history of depression or anxiety.  All other systems were reviewed and are negative.   Physical Exam: Filed Vitals:   09/29/13 0841  BP: 118/68  Pulse: 59   the general appearance reveals a well-developed well-nourished woman in no distress.The head and neck exam reveals pupils equal and reactive.  Extraocular movements are full.  There is no scleral icterus.  I checked her ears and there is no evidence of any otitis media. The mouth and pharynx are normal.  The neck is supple.  The carotids reveal no bruits.  The jugular venous pressure is normal.  The  thyroid is not enlarged.  There is no lymphadenopathy.  The chest is clear to percussion and auscultation.  There are no rales or rhonchi.  Expansion of the chest is symmetrical.  The precordium is quiet.  The first heart sound is normal.  The second heart sound is physiologically split.  There is no murmur gallop rub or click.   There is no abnormal lift or heave.  The abdomen is soft and nontender.  The bowel sounds are normal.  The liver and spleen are not enlarged.  There are no abdominal masses.  There are no abdominal bruits.  Extremities reveal good pedal pulses.  There is no phlebitis or edema.  There is no cyanosis or clubbing.  Strength is normal and symmetrical in all extremities.  There is no lateralizing weakness.  There are no sensory deficits.  The skin is warm and dry.  There is no rash.      Assessment / Plan: Continue same medication.  Blood work today pending.  Recheck in 4 months for office visit lipid panel hepatic function panel and basal metabolic panel.

## 2013-09-29 NOTE — Patient Instructions (Signed)
Will obtain labs today and call you with the results (bmet/hfp/lp)  Your physician recommends that you continue on your current medications as directed. Please refer to the Current Medication list given to you today.  Your physician wants you to follow-up in: 4 months with fasting labs (lp/bmet/hfp)  You will receive a reminder letter in the mail two months in advance. If you don't receive a letter, please call our office to schedule the follow-up appointment.

## 2013-09-29 NOTE — Assessment & Plan Note (Signed)
Her situational stress appears to be improved today.  She is less anxious.  She has been working hard on diet and exercise and her weight is down 6 pounds.

## 2013-10-05 ENCOUNTER — Other Ambulatory Visit: Payer: Self-pay | Admitting: Gastroenterology

## 2013-10-26 ENCOUNTER — Other Ambulatory Visit: Payer: Self-pay | Admitting: Cardiology

## 2013-12-01 ENCOUNTER — Other Ambulatory Visit: Payer: Self-pay | Admitting: Dermatology

## 2013-12-05 ENCOUNTER — Other Ambulatory Visit: Payer: Self-pay | Admitting: Cardiology

## 2014-01-19 ENCOUNTER — Other Ambulatory Visit: Payer: Self-pay | Admitting: Dermatology

## 2014-03-28 ENCOUNTER — Ambulatory Visit (INDEPENDENT_AMBULATORY_CARE_PROVIDER_SITE_OTHER): Payer: Medicare Other | Admitting: Cardiology

## 2014-03-28 ENCOUNTER — Other Ambulatory Visit: Payer: Medicare Other

## 2014-03-28 VITALS — BP 122/76 | HR 53 | Ht 67.0 in | Wt 164.0 lb

## 2014-03-28 DIAGNOSIS — E7800 Pure hypercholesterolemia, unspecified: Secondary | ICD-10-CM

## 2014-03-28 DIAGNOSIS — I119 Hypertensive heart disease without heart failure: Secondary | ICD-10-CM

## 2014-03-28 MED ORDER — LOSARTAN POTASSIUM-HCTZ 100-12.5 MG PO TABS: 1.0000 | ORAL_TABLET | Freq: Every day | ORAL | Status: DC

## 2014-03-28 NOTE — Progress Notes (Signed)
Cynthia Jenkins Date of Birth:  Dec 06, 1940 [address] [place], [place]  [phone]  Fax   [phone]  HPI: This pleasant 73 year old woman is seen back for a followup office visit. She has a past history of hypercholesterolemia and essential hypertension. Since last visit she has been feeling well with no new cardiac symptoms.  She does not have any history of ischemic heart disease.  Since last visit she had a colonoscopy.  No cancer was found.  She did have problems with nausea and vomiting postop and lost a lot of weight. She has a strong family history of heart and other problems.  Her mother died at 56 of diabetes.  Her father died at 35 of emphysema from smoking.  She had a sister who died of ovarian cancer at age 62 and a brother who died of heart trouble at age 17. Current Outpatient Prescriptions  Medication Sig Dispense Refill  . aspirin 81 MG tablet Take 81 mg by mouth daily.        . carvedilol (COREG) 12.5 MG tablet TAKE 1 TABLET TWICE A DAY WITH MEALS  180 tablet  2  . Cholecalciferol (VITAMIN D PO) Take by mouth daily.        . ciprofloxacin (CIPRO) 500 MG tablet       . Cyanocobalamin (VITAMIN B 12 PO) Take by mouth daily.        Marland Kitchen guaiFENesin (MUCINEX) 600 MG 12 hr tablet Take 1 tablet (600 mg total) by mouth 2 (two) times daily as needed for congestion.      Marland Kitchen losartan (COZAAR) 100 MG tablet       . Omega-3 Fatty Acids (FISH OIL PO) Take by mouth daily.       . Omeprazole (PRILOSEC PO) Take by mouth daily.        . pravastatin (PRAVACHOL) 40 MG tablet TAKE 1 TABLET DAILY  90 tablet  2  . losartan-hydrochlorothiazide (HYZAAR) 100-12.5 MG per tablet Take 1 tablet by mouth daily.  90 tablet  3   No current facility-administered medications for this visit.    Allergies  Allergen Reactions  . Aceon [Perindopril Erbumine]     cough  . Codeine   . Crestor [Rosuvastatin Calcium]     aching  . Diovan [Valsartan]     Dizzy,rash  . Norvasc  [Amlodipine Besylate]   . Zetia [Ezetimibe]     cramps    Patient Active Problem List   Diagnosis Date Noted  . Benign hypertensive heart disease without heart failure 11/29/2010    Priority: High  . Hypercholesterolemia     Priority: High  . Situational stress   . Stress fracture     History  Smoking status  . Former Smoker  . Types: Cigarettes  Smokeless tobacco  . Not on file    History  Alcohol Use No    Family History  Problem Relation Age of Onset  . Heart disease Mother   . Diabetes Mother   . Emphysema Father   . Ovarian cancer Sister   . Coronary artery disease Brother     Review of Systems: The patient denies any heat or cold intolerance.  No weight gain or weight loss.  The patient denies headaches or blurry vision.  There is no cough or sputum production.  The patient denies dizziness.  There is no hematuria or hematochezia.  The patient denies any muscle aches or arthritis.  The patient denies  any rash.  The patient denies frequent falling or instability.  There is no history of depression or anxiety.  All other systems were reviewed and are negative.   Physical Exam: Filed Vitals:   03/28/14 0826  BP: 122/76  Pulse: 53   the general appearance reveals a well-developed well-nourished woman in no distress.The head and neck exam reveals pupils equal and reactive.  Extraocular movements are full.  There is no scleral icterus.  I checked her ears and there is no evidence of any otitis media. The mouth and pharynx are normal.  The neck is supple.  The carotids reveal no bruits.  The jugular venous pressure is normal.  The  thyroid is not enlarged.  There is no lymphadenopathy.  The chest is clear to percussion and auscultation.  There are no rales or rhonchi.  Expansion of the chest is symmetrical.  The precordium is quiet.  The first heart sound is normal.  The second heart sound is physiologically split.  There is no murmur gallop rub or click.  There is no  abnormal lift or heave.  The abdomen is soft and nontender.  The bowel sounds are normal.  The liver and spleen are not enlarged.  There are no abdominal masses.  There are no abdominal bruits.  Extremities reveal good pedal pulses.  There is no phlebitis or edema.  There is no cyanosis or clubbing.  Strength is normal and symmetrical in all extremities.  There is no lateralizing weakness.  There are no sensory deficits.  The skin is warm and dry.  There is no rash.      Assessment / Plan: Continue same medication.  Continue careful diet.  Recheck in 6 months for office visit lipid panel hepatic function panel and basal metabolic panel.  Start losartan HCT 100/12.5 one daily.

## 2014-03-28 NOTE — Assessment & Plan Note (Signed)
The patient has not been experiencing any myalgias from her pravastatin.  She had lab work this summer at her PCP office so we did not draw any labs today.  She has lost 14 pounds since her last office visit by cutting back on starches.

## 2014-03-28 NOTE — Patient Instructions (Addendum)
DECREASE YOUR LOSARTAN TO 100-12.5 MG DAILY, Rx sent to [facility]   Did call in Rx for HCTZ 25 mg 1/2 tablet to take with the current Losartan 100 mg tablets you have until you use them up   Your physician wants you to follow-up in: 6 months with fasting labs (lp/bmet/hfp)  You will receive a reminder letter in the mail two months in advance. If you don't receive a letter, please call our office to schedule the follow-up appointment.

## 2014-03-28 NOTE — Assessment & Plan Note (Signed)
The patient previously was on losartan HCT 100/25 daily.  After she had her problems with low blood pressure post colonoscopy she was switched to plain losartan.  Now however she has gone back to her previous dose of 100/25 which may be slightly strong for her.  We will switch her to 100/12.5 and we gave her a new prescription for that today

## 2014-03-29 ENCOUNTER — Encounter: Payer: Self-pay | Admitting: Cardiology

## 2014-08-08 ENCOUNTER — Other Ambulatory Visit: Payer: Self-pay

## 2014-08-08 DIAGNOSIS — Z1231 Encounter for screening mammogram for malignant neoplasm of breast: Secondary | ICD-10-CM

## 2014-09-12 ENCOUNTER — Ambulatory Visit: Payer: Self-pay

## 2014-09-12 ENCOUNTER — Other Ambulatory Visit (INDEPENDENT_AMBULATORY_CARE_PROVIDER_SITE_OTHER): Payer: Medicare Other | Admitting: *Deleted

## 2014-09-12 DIAGNOSIS — E7800 Pure hypercholesterolemia, unspecified: Secondary | ICD-10-CM

## 2014-09-12 DIAGNOSIS — I119 Hypertensive heart disease without heart failure: Secondary | ICD-10-CM

## 2014-09-12 LAB — LIPID PANEL
CHOLESTEROL: 163 mg/dL (ref 0–200)
HDL: 53.8 mg/dL (ref >39.00)
LDL Cholesterol: 87 mg/dL (ref 0–99)
NonHDL: 109.2
TRIGLYCERIDES: 109 mg/dL (ref 0.0–149.0)
Total CHOL/HDL Ratio: 3
VLDL: 21.8 mg/dL (ref 0.0–40.0)

## 2014-09-12 LAB — BASIC METABOLIC PANEL
BUN: 15 mg/dL (ref 6–23)
CO2: 32 mEq/L (ref 19–32)
Calcium: 9.4 mg/dL (ref 8.4–10.5)
Chloride: 104 mEq/L (ref 96–112)
Creatinine, Ser: 0.96 mg/dL (ref 0.40–1.20)
GFR: 60.22 mL/min (ref >60.00)
Glucose, Bld: 108 mg/dL — ABNORMAL HIGH (ref 70–99)
POTASSIUM: 4.2 meq/L (ref 3.5–5.1)
Sodium: 138 mEq/L (ref 135–145)

## 2014-09-12 LAB — HEPATIC FUNCTION PANEL
ALT: 21 U/L (ref 0–35)
AST: 20 U/L (ref 0–37)
Albumin: 3.7 g/dL (ref 3.5–5.2)
Alkaline Phosphatase: 60 U/L (ref 39–117)
Bilirubin, Direct: 0.2 mg/dL (ref 0.0–0.3)
TOTAL PROTEIN: 6.7 g/dL (ref 6.0–8.3)
Total Bilirubin: 0.6 mg/dL (ref 0.2–1.2)

## 2014-09-12 NOTE — Addendum Note (Signed)
Addended by: Tonita PhoenixBOWDEN, Albert Hersch K on: 09/12/2014 09:09 AM   Modules accepted: Orders

## 2014-09-13 ENCOUNTER — Ambulatory Visit
Admission: RE | Admit: 2014-09-13 | Discharge: 2014-09-13 | Disposition: A | Discharge status: Home / Self Care | Payer: Medicare Other | Source: Ambulatory Visit

## 2014-09-13 ENCOUNTER — Ambulatory Visit (INDEPENDENT_AMBULATORY_CARE_PROVIDER_SITE_OTHER): Payer: Medicare Other | Admitting: Cardiology

## 2014-09-13 VITALS — BP 136/80 | HR 54 | Ht 67.0 in | Wt 166.2 lb

## 2014-09-13 DIAGNOSIS — F439 Reaction to severe stress, unspecified: Secondary | ICD-10-CM

## 2014-09-13 DIAGNOSIS — Z1231 Encounter for screening mammogram for malignant neoplasm of breast: Secondary | ICD-10-CM

## 2014-09-13 DIAGNOSIS — Z658 Other specified problems related to psychosocial circumstances: Secondary | ICD-10-CM

## 2014-09-13 DIAGNOSIS — E7800 Pure hypercholesterolemia, unspecified: Secondary | ICD-10-CM

## 2014-09-13 DIAGNOSIS — I119 Hypertensive heart disease without heart failure: Secondary | ICD-10-CM

## 2014-09-13 MED ORDER — CARVEDILOL 12.5 MG PO TABS: 12.5000 mg | ORAL_TABLET | Freq: Two times a day (BID) | ORAL | Status: DC

## 2014-09-13 MED ORDER — LOSARTAN POTASSIUM-HCTZ 100-12.5 MG PO TABS: 1.0000 | ORAL_TABLET | Freq: Every day | ORAL | Status: AC

## 2014-09-13 MED ORDER — PRAVASTATIN SODIUM 40 MG PO TABS: 40.0000 mg | ORAL_TABLET | Freq: Every day | ORAL | Status: DC

## 2014-09-13 NOTE — Progress Notes (Signed)
Quick Note:  Please make copy of labs for patient visit. ______ 

## 2014-09-13 NOTE — Patient Instructions (Signed)
Your physician recommends that you continue on your current medications as directed. Please refer to the Current Medication list given to you today.  Your physician wants you to follow-up in: 6 months with fasting labs (lp/bmet/hfp)  You will receive a reminder letter in the mail two months in advance. If you don't receive a letter, please call our office to schedule the follow-up appointment.  

## 2014-09-13 NOTE — Progress Notes (Signed)
Cardiology Office Note   Date:  09/13/2014   ID:  Cynthia Jenkins, DOB 06/18/1941, MRN [id number]  PCP:  Hoyle SauerAVVA,RAVISANKAR R, MD  Cardiologist:   Cassell Clementhomas Lavetta Geier, MD   No chief complaint on file.     History of Present Illness: Cynthia Jenkins is a 74 y.o. female who presents for six-month office visit  This pleasant 74 year old woman is seen back for a followup office visit. She has a past history of hypercholesterolemia and essential hypertension. Since last visit she has been feeling well with no new cardiac symptoms. She does not have any history of ischemic heart disease.  She has a strong family history of heart and other problems. Her mother died at 2768 of diabetes. Her father died at 1972 of emphysema from smoking. She had a sister who died of ovarian cancer at age 75 and a brother who died of heart trouble at age 75. The patient is under a lot of stress.  Her husband had some x-rays recently which indicated a possible cyst on his sole remaining kidney.  They are awaiting a consultation with urology.  Past Medical History  Diagnosis Date  . Hypertension   . Hypercholesterolemia   . Situational stress   . Stress fracture     right foot    Past Surgical History  Procedure Laterality Date  . Other surgical history  1973    hysterectomy  . Rectal surgery       Current Outpatient Prescriptions  Medication Sig Dispense Refill  . aspirin 81 MG tablet Take 81 mg by mouth daily.      . carvedilol (COREG) 12.5 MG tablet Take 1 tablet (12.5 mg total) by mouth 2 (two) times daily with a meal. 180 tablet 1  . Cholecalciferol (VITAMIN D PO) Take by mouth daily.      . ciprofloxacin (CIPRO) 500 MG tablet Take 500 mg by mouth daily.     . Cyanocobalamin (VITAMIN B 12 PO) Take by mouth daily.      Marland Kitchen. guaiFENesin (MUCINEX) 600 MG 12 hr tablet Take 1 tablet (600 mg total) by mouth 2 (two) times daily as needed for congestion.    Marland Kitchen. losartan-hydrochlorothiazide (HYZAAR) 100-12.5 MG  per tablet Take 1 tablet by mouth daily. 90 tablet 3  . Omega-3 Fatty Acids (FISH OIL PO) Take by mouth daily.     . Omeprazole (PRILOSEC PO) Take by mouth daily.      . pravastatin (PRAVACHOL) 40 MG tablet Take 1 tablet (40 mg total) by mouth daily. 90 tablet 3   No current facility-administered medications for this visit.    Allergies:   Aceon; Codeine; Crestor; Diovan; Norvasc; and Zetia    Social History:  The patient  reports that she has quit smoking. Her smoking use included Cigarettes. She does not have any smokeless tobacco history on file. She reports that she does not drink alcohol or use illicit drugs.   Family History:  The patient's family history includes Coronary artery disease in her brother; Diabetes in her mother; Emphysema in her father; Heart disease in her mother; Ovarian cancer in her sister.    ROS:  Please see the history of present illness.   Otherwise, review of systems are positive for none.   All other systems are reviewed and negative.    PHYSICAL EXAM: VS:  BP 136/80 mmHg  Pulse 54  Ht 5\' 7"  (1.702 m)  Wt 166 lb 3.2 oz (75.388 kg)  BMI 26.02 kg/m2 ,  BMI Body mass index is 26.02 kg/(m^2). GEN: Well nourished, well developed, in no acute distress HEENT: normal Neck: no JVD, carotid bruits, or masses Cardiac: RRR; no murmurs, rubs, or gallops,no edema  Respiratory:  clear to auscultation bilaterally, normal work of breathing GI: soft, nontender, nondistended, + BS MS: no deformity or atrophy Skin: warm and dry, no rash Neuro:  Strength and sensation are intact Psych: euthymic mood, full affect   EKG:  EKG is not ordered today.    Recent Labs: 09/12/2014: ALT 21; BUN 15; Creatinine 0.96; Potassium 4.2; Sodium 138    Lipid Panel    Component Value Date/Time   CHOL 163 09/12/2014 0909   TRIG 109.0 09/12/2014 0909   HDL 53.80 09/12/2014 0909   CHOLHDL 3 09/12/2014 0909   VLDL 21.8 09/12/2014 0909   LDLCALC 87 09/12/2014 0909      Wt  Readings from Last 3 Encounters:  09/13/14 166 lb 3.2 oz (75.388 kg)  03/28/14 164 lb (74.39 kg)  09/29/13 178 lb (80.74 kg)        ASSESSMENT AND PLAN:  1.  Hypercholesterolemia 2.  Essential hypertension without heart failure   Current medicines are reviewed at length with the patient today.  The patient does not have concerns regarding medicines.  The following changes have been made:  no change    Orders Placed This Encounter  Procedures  . Lipid panel  . Hepatic function panel  . Basic metabolic panel    Disposition: Continue current medication.  Recheck in 6 months for office visit and fasting lipid panel hepatic function panel and basal metabolic panel.  Her weight is up.  She will try harder to lose weight.  Continue to walk regularly.  Signed, Cassell Clement, MD  09/13/2014 1:33 PM    [facility] [address] [place], [place], [place]  [address] Phone: [phone]; Fax: [phone]

## 2015-03-22 ENCOUNTER — Other Ambulatory Visit: Payer: Self-pay | Admitting: Cardiology

## 2015-05-25 ENCOUNTER — Encounter: Payer: Self-pay | Admitting: Cardiology

## 2015-05-25 ENCOUNTER — Other Ambulatory Visit (INDEPENDENT_AMBULATORY_CARE_PROVIDER_SITE_OTHER): Payer: Medicare Other | Admitting: *Deleted

## 2015-05-25 ENCOUNTER — Ambulatory Visit (INDEPENDENT_AMBULATORY_CARE_PROVIDER_SITE_OTHER): Payer: Medicare Other | Admitting: Cardiology

## 2015-05-25 VITALS — BP 110/70 | HR 54 | Ht 67.5 in | Wt 165.8 lb

## 2015-05-25 DIAGNOSIS — I119 Hypertensive heart disease without heart failure: Secondary | ICD-10-CM | POA: Diagnosis not present

## 2015-05-25 DIAGNOSIS — E7800 Pure hypercholesterolemia, unspecified: Secondary | ICD-10-CM

## 2015-05-25 LAB — HEPATIC FUNCTION PANEL
ALT: 23 U/L (ref 6–29)
AST: 22 U/L (ref 10–35)
Albumin: 3.4 g/dL — ABNORMAL LOW (ref 3.6–5.1)
Alkaline Phosphatase: 62 U/L (ref 33–130)
BILIRUBIN DIRECT: 0.2 mg/dL (ref <=0.2)
BILIRUBIN INDIRECT: 0.5 mg/dL (ref 0.2–1.2)
Total Bilirubin: 0.7 mg/dL (ref 0.2–1.2)
Total Protein: 6.3 g/dL (ref 6.1–8.1)

## 2015-05-25 LAB — BASIC METABOLIC PANEL
BUN: 14 mg/dL (ref 7–25)
CALCIUM: 9.1 mg/dL (ref 8.6–10.4)
CO2: 28 mmol/L (ref 20–31)
CREATININE: 0.76 mg/dL (ref 0.60–0.93)
Chloride: 104 mmol/L (ref 98–110)
GLUCOSE: 107 mg/dL — AB (ref 65–99)
Potassium: 4 mmol/L (ref 3.5–5.3)
Sodium: 140 mmol/L (ref 135–146)

## 2015-05-25 LAB — LIPID PANEL
CHOLESTEROL: 141 mg/dL (ref 125–200)
HDL: 45 mg/dL — ABNORMAL LOW (ref >=46)
LDL Cholesterol: 74 mg/dL (ref <130)
Total CHOL/HDL Ratio: 3.1 Ratio (ref <=5.0)
Triglycerides: 108 mg/dL (ref <150)
VLDL: 22 mg/dL (ref <30)

## 2015-05-25 NOTE — Progress Notes (Signed)
Cardiology Office Note   Date:  05/25/2015   ID:  Cynthia Jenkins, DOB 21-Sep-1940, MRN [id number]  PCP:  Hoyle Sauer, MD  Cardiologist: Cassell Clement MD  Chief Complaint  Patient presents with  . Hypertension      History of Present Illness: Cynthia Jenkins is a 74 y.o. female who presents for a scheduled follow-up visit  This pleasant 74 year old woman is seen back for a followup office visit. She has a past history of hypercholesterolemia and essential hypertension. Since last visit she has been feeling well with no new cardiac symptoms. She does not have any history of ischemic heart disease.  She has a strong family history of heart and other problems. Her mother died at 60 of diabetes. Her father died at 75 of emphysema from smoking. She had a sister who died of ovarian cancer at age 43 and a brother who died of heart trouble at age 6. Since last visit she has not been having any dizziness or syncope.  No chest pain.  No palpitations. Past Medical History  Diagnosis Date  . Hypertension   . Hypercholesterolemia   . Situational stress   . Stress fracture     right foot    Past Surgical History  Procedure Laterality Date  . Other surgical history  1973    hysterectomy  . Rectal surgery       Current Outpatient Prescriptions  Medication Sig Dispense Refill  . aspirin 81 MG tablet Take 81 mg by mouth daily.      . carvedilol (COREG) 12.5 MG tablet Take 12.5 mg by mouth 2 (two) times daily with a meal.    . Cholecalciferol (VITAMIN D PO) Take 1 tablet by mouth daily.     . Cyanocobalamin (VITAMIN B 12 PO) Take 1 tablet by mouth daily.     Marland Kitchen guaiFENesin (MUCINEX) 600 MG 12 hr tablet Take 1 tablet (600 mg total) by mouth 2 (two) times daily as needed for congestion.    Marland Kitchen losartan-hydrochlorothiazide (HYZAAR) 100-12.5 MG per tablet Take 1 tablet by mouth daily. 90 tablet 3  . Omega-3 Fatty Acids (FISH OIL PO) Take 1 capsule by mouth daily.     .  Omeprazole (PRILOSEC PO) Take 1 tablet by mouth daily.     . pravastatin (PRAVACHOL) 40 MG tablet Take 1 tablet (40 mg total) by mouth daily. 90 tablet 3   No current facility-administered medications for this visit.    Allergies:   Aceon; Codeine; Crestor; Diovan; Norvasc; and Zetia    Social History:  The patient  reports that she has quit smoking. Her smoking use included Cigarettes. She does not have any smokeless tobacco history on file. She reports that she does not drink alcohol or use illicit drugs.   Family History:  The patient's family history includes Coronary artery disease in her brother; Diabetes in her mother; Emphysema in her father; Heart disease in her mother; Ovarian cancer in her sister.    ROS:  Please see the history of present illness.   Otherwise, review of systems are positive for none.   All other systems are reviewed and negative.    PHYSICAL EXAM: VS:  BP 110/70 mmHg  Pulse 54  Ht 5' 7.5" (1.715 m)  Wt 165 lb 12.8 oz (75.206 kg)  BMI 25.57 kg/m2 , BMI Body mass index is 25.57 kg/(m^2). GEN: Well nourished, well developed, in no acute distress HEENT: normal Neck: no JVD, carotid bruits, or masses Cardiac:  RRR; no murmurs, rubs, or gallops,no edema  Respiratory:  clear to auscultation bilaterally, normal work of breathing GI: soft, nontender, nondistended, + BS MS: no deformity or atrophy Skin: warm and dry, no rash Neuro:  Strength and sensation are intact Psych: euthymic mood, full affect   EKG:  EKG is ordered today. The ekg ordered today demonstrates sinus bradycardia at 54 bpm.  Nonspecific STT wave abnormality unchanged from 12/04/11   Recent Labs: 09/12/2014: ALT 21; BUN 15; Creatinine, Ser 0.96; Potassium 4.2; Sodium 138    Lipid Panel    Component Value Date/Time   CHOL 163 09/12/2014 0909   TRIG 109.0 09/12/2014 0909   HDL 53.80 09/12/2014 0909   CHOLHDL 3 09/12/2014 0909   VLDL 21.8 09/12/2014 0909   LDLCALC 87 09/12/2014 0909       Wt Readings from Last 3 Encounters:  05/25/15 165 lb 12.8 oz (75.206 kg)  09/13/14 166 lb 3.2 oz (75.388 kg)  03/28/14 164 lb (74.39 kg)        ASSESSMENT AND PLAN:  1. Hypercholesterolemia 2. Essential hypertension without heart failure   Current medicines are reviewed at length with the patient today.  The patient does not have concerns regarding medicines.  The following changes have been made:  no change  Labs/ tests ordered today include:   Orders Placed This Encounter  Procedures  . EKG 12-Lead     Disposition:   We are checking lab work today.  Patient will continue current medication.  She will follow-up with Dr. Felipa EthAvva.  Return here as needed  Karie SchwalbeSigned, Jacques Fife MD 05/25/2015 9:52 AM    [facility] [address], [place], [place]  [address] Phone: [phone]; Fax: [phone]

## 2015-05-25 NOTE — Patient Instructions (Signed)
Medication Instructions:  Your physician recommends that you continue on your current medications as directed. Please refer to the Current Medication list given to you today.  Labwork: LP/BMET/HFP  Testing/Procedures: NONE  Follow-Up: AS NEEDED   If you need a refill on your cardiac medications before your next appointment, please call your pharmacy.

## 2015-05-25 NOTE — Progress Notes (Signed)
Quick Note:  Please report to patient. The recent labs are stable. Continue same medication and careful diet. ______ 

## 2015-05-25 NOTE — Addendum Note (Signed)
Addended by: Tonita PhoenixBOWDEN, Kimberly Coye K on: 05/25/2015 08:48 AM   Modules accepted: Orders

## 2015-07-20 ENCOUNTER — Other Ambulatory Visit: Payer: Self-pay | Admitting: Orthopedic Surgery

## 2015-07-20 DIAGNOSIS — M25561 Pain in right knee: Secondary | ICD-10-CM

## 2015-07-25 ENCOUNTER — Ambulatory Visit
Admission: RE | Admit: 2015-07-25 | Discharge: 2015-07-25 | Disposition: A | Discharge status: Home / Self Care | Payer: Medicare Other | Source: Ambulatory Visit | Attending: Orthopedic Surgery | Admitting: Orthopedic Surgery

## 2015-07-25 DIAGNOSIS — M25561 Pain in right knee: Secondary | ICD-10-CM

## 2015-08-25 ENCOUNTER — Other Ambulatory Visit: Payer: Self-pay

## 2015-08-25 DIAGNOSIS — Z1231 Encounter for screening mammogram for malignant neoplasm of breast: Secondary | ICD-10-CM

## 2015-09-20 ENCOUNTER — Ambulatory Visit
Admission: RE | Admit: 2015-09-20 | Discharge: 2015-09-20 | Disposition: A | Discharge status: Home / Self Care | Payer: Medicare Other | Source: Ambulatory Visit

## 2015-09-20 DIAGNOSIS — Z1231 Encounter for screening mammogram for malignant neoplasm of breast: Secondary | ICD-10-CM

## 2015-10-19 ENCOUNTER — Other Ambulatory Visit: Payer: Self-pay | Admitting: *Deleted

## 2016-08-19 ENCOUNTER — Other Ambulatory Visit: Payer: Self-pay | Admitting: Internal Medicine

## 2016-08-19 DIAGNOSIS — Z1231 Encounter for screening mammogram for malignant neoplasm of breast: Secondary | ICD-10-CM

## 2016-09-24 ENCOUNTER — Encounter: Payer: Self-pay | Admitting: Radiology

## 2016-09-24 ENCOUNTER — Ambulatory Visit
Admission: RE | Admit: 2016-09-24 | Discharge: 2016-09-24 | Disposition: A | Discharge status: Home / Self Care | Payer: Medicare Other | Source: Ambulatory Visit | Attending: Internal Medicine | Admitting: Internal Medicine

## 2016-09-24 DIAGNOSIS — Z1231 Encounter for screening mammogram for malignant neoplasm of breast: Secondary | ICD-10-CM

## 2017-02-18 ENCOUNTER — Ambulatory Visit
Admission: RE | Admit: 2017-02-18 | Discharge: 2017-02-18 | Disposition: A | Discharge status: Home / Self Care | Payer: Medicare Other | Source: Ambulatory Visit | Attending: Internal Medicine | Admitting: Internal Medicine

## 2017-02-18 ENCOUNTER — Other Ambulatory Visit: Payer: Self-pay | Admitting: Internal Medicine

## 2017-02-18 DIAGNOSIS — K5732 Diverticulitis of large intestine without perforation or abscess without bleeding: Secondary | ICD-10-CM

## 2017-02-18 DIAGNOSIS — R1084 Generalized abdominal pain: Secondary | ICD-10-CM

## 2017-03-24 ENCOUNTER — Ambulatory Visit
Admission: RE | Admit: 2017-03-24 | Discharge: 2017-03-24 | Disposition: A | Discharge status: Home / Self Care | Payer: Medicare Other | Source: Ambulatory Visit | Attending: Internal Medicine | Admitting: Internal Medicine

## 2017-03-24 DIAGNOSIS — K5732 Diverticulitis of large intestine without perforation or abscess without bleeding: Secondary | ICD-10-CM

## 2017-04-28 ENCOUNTER — Ambulatory Visit (HOSPITAL_COMMUNITY)
Admission: EM | Admit: 2017-04-28 | Discharge: 2017-04-28 | Disposition: A | Discharge status: Home / Self Care | Payer: Medicare Other

## 2017-04-28 ENCOUNTER — Emergency Department (HOSPITAL_COMMUNITY): Payer: Medicare Other

## 2017-04-28 ENCOUNTER — Encounter (HOSPITAL_COMMUNITY): Payer: Self-pay

## 2017-04-28 ENCOUNTER — Emergency Department (HOSPITAL_COMMUNITY)
Admission: EM | Admit: 2017-04-28 | Discharge: 2017-04-28 | Disposition: A | Discharge status: Home / Self Care | Payer: Medicare Other | Attending: Emergency Medicine | Admitting: Emergency Medicine

## 2017-04-28 DIAGNOSIS — Z23 Encounter for immunization: Secondary | ICD-10-CM | POA: Diagnosis not present

## 2017-04-28 DIAGNOSIS — Z87891 Personal history of nicotine dependence: Secondary | ICD-10-CM | POA: Diagnosis not present

## 2017-04-28 DIAGNOSIS — I10 Essential (primary) hypertension: Secondary | ICD-10-CM | POA: Diagnosis not present

## 2017-04-28 DIAGNOSIS — Y9289 Other specified places as the place of occurrence of the external cause: Secondary | ICD-10-CM | POA: Diagnosis not present

## 2017-04-28 DIAGNOSIS — S61411A Laceration without foreign body of right hand, initial encounter: Secondary | ICD-10-CM | POA: Insufficient documentation

## 2017-04-28 DIAGNOSIS — S0990XA Unspecified injury of head, initial encounter: Secondary | ICD-10-CM

## 2017-04-28 DIAGNOSIS — Z7982 Long term (current) use of aspirin: Secondary | ICD-10-CM | POA: Diagnosis not present

## 2017-04-28 DIAGNOSIS — S022XXA Fracture of nasal bones, initial encounter for closed fracture: Secondary | ICD-10-CM | POA: Insufficient documentation

## 2017-04-28 DIAGNOSIS — Y9301 Activity, walking, marching and hiking: Secondary | ICD-10-CM | POA: Diagnosis not present

## 2017-04-28 DIAGNOSIS — W01198A Fall on same level from slipping, tripping and stumbling with subsequent striking against other object, initial encounter: Secondary | ICD-10-CM | POA: Insufficient documentation

## 2017-04-28 DIAGNOSIS — S01111A Laceration without foreign body of right eyelid and periocular area, initial encounter: Secondary | ICD-10-CM | POA: Insufficient documentation

## 2017-04-28 DIAGNOSIS — W19XXXA Unspecified fall, initial encounter: Secondary | ICD-10-CM

## 2017-04-28 DIAGNOSIS — Z79899 Other long term (current) drug therapy: Secondary | ICD-10-CM | POA: Diagnosis not present

## 2017-04-28 DIAGNOSIS — Y999 Unspecified external cause status: Secondary | ICD-10-CM | POA: Diagnosis not present

## 2017-04-28 MED ORDER — TETANUS-DIPHTH-ACELL PERTUSSIS 5-2.5-18.5 LF-MCG/0.5 IM SUSP
0.5000 mL | Freq: Once | INTRAMUSCULAR | Status: AC
Start: 2017-04-28 — End: 2017-04-28
  Administered 2017-04-28: 0.5 mL via INTRAMUSCULAR
  Filled 2017-04-28: qty 0.5

## 2017-04-28 MED ORDER — LIDOCAINE HCL (PF) 1 % IJ SOLN
30.0000 mL | Freq: Once | INTRAMUSCULAR | Status: AC
  Administered 2017-04-28: 30 mL via INTRADERMAL
  Filled 2017-04-28: qty 30

## 2017-04-28 MED ORDER — ACETAMINOPHEN 325 MG PO TABS
650.0000 mg | ORAL_TABLET | Freq: Once | ORAL | Status: AC
  Administered 2017-04-28: 650 mg via ORAL
  Filled 2017-04-28: qty 2

## 2017-04-28 MED ORDER — TRAMADOL HCL 50 MG PO TABS: 50.0000 mg | ORAL_TABLET | Freq: Four times a day (QID) | ORAL | 0 refills | Status: AC | PRN

## 2017-04-28 NOTE — ED Notes (Signed)
Pt ambulatory and independent at discharge.  Verbalized understanding of discharge instructions 

## 2017-04-28 NOTE — ED Triage Notes (Signed)
Patient states she was walking and states she was walking too fast and fell. Patient has a laceration above the right eyebrow area. Patient denies any blood thinners, but takes aspirin 80 mg daily. Patient deneis any dizziness, N/V.

## 2017-04-28 NOTE — Discharge Instructions (Signed)
WOUND CARE ° ° Keep area clean and dry for 24 hours. Do not remove °bandage, if applied. ° After 24 hours, remove bandage and wash wound °gently with mild soap and warm water. Reapply °a new bandage after cleaning wound, if directed. ° Continue daily cleansing with soap and water until °stitches/staples are removed. ° Do not apply any ointments or creams to the wound °while stitches/staples are in place, as this may cause °delayed healing. ° Seek medical careif you experience any of the following °signs of infection: Swelling, redness, pus drainage, °streaking, fever >101.0 F ° Seek care if you experience excessive bleeding °that does not stop after 15-20 minutes of constant, firm °pressure. ° ° °

## 2017-04-28 NOTE — ED Provider Notes (Signed)
[facility]-EMERGENCY DEPT Provider Note   CSN: [id number] Arrival date & time: 04/28/17  1432     History   Chief Complaint Chief Complaint  Patient presents with  . Fall  . Head Injury  . Facial Laceration    HPI Cynthia Jenkins is a 76 y.o. female.  The history is provided by the patient. No language interpreter was used.  Fall  This is a new problem. The current episode started 1 to 2 hours ago. The problem occurs rarely. Pertinent negatives include no chest pain, no abdominal pain, no headaches and no shortness of breath.  Head Injury   The incident occurred 1 to 2 hours ago. She came to the ER via walk-in. The injury mechanism was a fall. There was no loss of consciousness. The volume of blood lost was minimal. The quality of the pain is described as sharp. The pain is at a severity of 4/10. The pain is moderate. The pain has been constant since the injury. Pertinent negatives include no numbness, no blurred vision, no vomiting, no tinnitus, no disorientation, no weakness and no memory loss. She has tried ice for the symptoms.  Hand Injury   The incident occurred 1 to 2 hours ago. The incident occurred in the street. The injury mechanism was a fall. The pain is present in the right hand. The quality of the pain is described as aching and sharp. The pain is at a severity of 4/10. The pain is moderate. The pain has been constant since the incident. She reports no foreign bodies present. The symptoms are aggravated by movement and palpation. She has tried immobilization and ice for the symptoms.    Past Medical History:  Diagnosis Date  . Hypercholesterolemia   . Hypertension   . Situational stress   . Stress fracture    right foot    Patient Active Problem List   Diagnosis Date Noted  . 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 Hypercholesterolemia   . Situational stress   . Stress fracture     Past Surgical History:    Procedure Laterality Date  . OTHER SURGICAL HISTORY  1973   hysterectomy  . RECTAL SURGERY      OB History    No data available       Home Medications    Prior to Admission medications   Medication Sig Start Date End Date Taking? Authorizing Provider  aspirin 81 MG tablet Take 81 mg by mouth daily.     Yes [provider]  buPROPion (WELLBUTRIN XL) 150 MG 24 hr tablet Take 150 mg by mouth daily. 04/01/17  Yes [provider]  carvedilol (COREG) 12.5 MG tablet Take 12.5 mg by mouth 2 (two) times daily with a meal.   Yes [provider]  Cholecalciferol (VITAMIN D PO) Take 1 tablet by mouth daily.    Yes [provider]  Cyanocobalamin (VITAMIN B 12 PO) Take 1 tablet by mouth daily.    Yes [provider]  losartan-hydrochlorothiazide (HYZAAR) 100-12.5 MG per tablet Take 1 tablet by mouth daily. 09/13/14  Yes Cassell Clement, MD  Omega-3 Fatty Acids (FISH OIL PO) Take 1 capsule by mouth daily.    Yes [provider]  Omeprazole (PRILOSEC PO) Take 1 tablet by mouth daily.    Yes [provider]  pravastatin (PRAVACHOL) 40 MG tablet Take 1 tablet (40 mg total) by mouth daily. 09/13/14  Yes Cassell Clement, MD  guaiFENesin (  MUCINEX) 600 MG 12 hr tablet Take 1 tablet (600 mg total) by mouth 2 (two) times daily as needed for congestion. Patient not taking: Reported on 04/28/2017 05/20/12   Cassell Clement, MD  traMADol (ULTRAM) 50 MG tablet Take 1 tablet (50 mg total) by mouth every 6 (six) hours as needed. 04/28/17   Arthor Captain, PA-C    Family History Family History  Problem Relation Age of Onset  . Heart disease Mother   . Diabetes Mother   . Emphysema Father   . Ovarian cancer Sister   . Breast cancer Sister   . Coronary artery disease Brother     Social History Social History  Substance Use Topics  . Smoking status: Former Smoker    Types: Cigarettes  . Smokeless tobacco: Never Used  . Alcohol use No      Allergies   Aceon [perindopril erbumine]; Codeine; Crestor [rosuvastatin calcium]; Diovan [valsartan]; Iodine; Norvasc [amlodipine besylate]; and Zetia [ezetimibe]   Review of Systems Review of Systems  Constitutional: Negative.   HENT: Negative for nosebleeds and tinnitus.   Eyes: Negative for blurred vision, photophobia, pain and visual disturbance.  Respiratory: Negative for shortness of breath.   Cardiovascular: Negative for chest pain.  Gastrointestinal: Negative for abdominal pain and vomiting.  Genitourinary: Negative.   Musculoskeletal: Positive for joint swelling. Negative for neck pain and neck stiffness.  Skin: Positive for wound.  Neurological: Negative for dizziness, syncope, facial asymmetry, speech difficulty, weakness, light-headedness, numbness and headaches.  Hematological: Negative.   Psychiatric/Behavioral: Negative.  Negative for memory loss.  All other systems reviewed and are negative.    Physical Exam Updated Vital Signs BP (!) 161/90 (BP Location: Left Arm)   Pulse 74   Temp 97.8 F (36.6 C) (Oral)   Resp 16   Ht 5' 7.5" (1.715 m)   Wt 74.4 kg (164 lb)   SpO2 95%   BMI 25.31 kg/m   Physical Exam  Constitutional: She is oriented to person, place, and time. She appears well-developed and well-nourished. No distress.  HENT:  Head: Normocephalic. Head is without raccoon's eyes and without Battle's sign.  Right Ear: Tympanic membrane normal. No hemotympanum.  Left Ear: Tympanic membrane normal. No hemotympanum.  Eyes: Pupils are equal, round, and reactive to light. Conjunctivae and EOM are normal. No scleral icterus.    Patient with periorbital ecchymosis on the R. No pain with eye movement. The patient has a 3 cm laceration through the lateral R eyebrow. She is tender along the the R zygoma.  Neck: Normal range of motion.  Cardiovascular: Normal rate, regular rhythm and normal heart sounds.  Exam reveals no gallop and no friction rub.   No  murmur heard. Pulmonary/Chest: Effort normal and breath sounds normal. No respiratory distress.  Abdominal: Soft. Bowel sounds are normal. She exhibits no distension and no mass. There is no tenderness. There is no guarding.  Musculoskeletal:       Hands: 4 cm laceration at the base of digits 3/4/5 on the R hand . FROM of the finger. Ecchymosis and tenderness of the fingers. Cap refill <2   Neurological: She is alert and oriented to person, place, and time.  Skin: Skin is warm and dry. She is not diaphoretic.  Psychiatric: Her behavior is normal.  Nursing note and vitals reviewed.  LACERATION REPAIR Performed by: Ermalinda Memos PA-S  Authorized by: Arthor Captain PA-C Consent: Verbal consent obtained. Risks and benefits: risks, benefits and alternatives were discussed Consent given by: patient Patient  identity confirmed: provided demographic data Prepped and Draped in normal sterile fashion Wound explored  Laceration Location: HAND   Laceration Length: 4 cm  No Foreign Bodies seen or palpated  Anesthesia: local infiltration  Local anesthetic: lidocaine 1% W/O epinephrine  Anesthetic total: 3 ml  Irrigation method: syringe Amount of cleaning: standard  Skin closure: 5.0 PROLENE  Number of sutures: 13  Technique: SI  Patient tolerance: Patient tolerated the procedure well with no immediate complications.  LACERATION REPAIR Performed by: Arthor Captain Authorized by: Arthor Captain Consent: Verbal consent obtained. Risks and benefits: risks, benefits and alternatives were discussed Consent given by: patient Patient identity confirmed: provided demographic data Prepped and Draped in normal sterile fashion Wound explored  Laceration Location: R EYEBORW  Laceration Length: 2cm  No Foreign Bodies seen or palpated  Anesthesia: local infiltration  Local anesthetic: lidocaine 1% W/O epinephrine  Anesthetic total: 2 ml  Irrigation method: syringe Amount of  cleaning: standard  Skin closure: 5.0 PROLENE  Number of sutures: 4  Technique: SI/VM  Patient tolerance: Patient tolerated the procedure well with no immediate complications.   ED Treatments / Results  Labs (all labs ordered are listed, but only abnormal results are displayed) Labs Reviewed - No data to display  EKG  EKG Interpretation None       Radiology Ct Head Wo Contrast  Result Date: 04/28/2017 CLINICAL DATA:  Laceration above the right eyebrow after falling while walking. The patient takes low-dose aspirin. EXAM: CT HEAD WITHOUT CONTRAST CT MAXILLOFACIAL WITHOUT CONTRAST CT CERVICAL SPINE WITHOUT CONTRAST TECHNIQUE: Multidetector CT imaging of the head, cervical spine, and maxillofacial structures were performed using the standard protocol without intravenous contrast. Multiplanar CT image reconstructions of the cervical spine and maxillofacial structures were also generated. COMPARISON:  None. FINDINGS: CT HEAD FINDINGS Brain: Diffusely enlarged ventricles and subarachnoid spaces. No intracranial hemorrhage, mass lesion or CT evidence of acute infarction. Vascular: No hyperdense vessel or unexpected calcification. Skull: Normal. Negative for fracture or focal lesion. Other: None. CT MAXILLOFACIAL FINDINGS Osseous: Right nasal bone fracture with mild superior displacement of the distal fragment Orbits: Negative. No traumatic or inflammatory finding. Sinuses: Clear. Soft tissues: Right supraorbital and infraorbital soft tissue swelling. There is also a right supraorbital soft tissue laceration with overlying bandage material and associated soft tissue air. No radiopaque foreign body. CT CERVICAL SPINE FINDINGS Alignment: Mild anterolisthesis at the C4-5 level. Skull base and vertebrae: No acute fracture. No primary bone lesion or focal pathologic process. Soft tissues and spinal canal: No prevertebral fluid or swelling. No visible canal hematoma. Disc levels: Multilevel degenerative  changes, including facet degenerative changes at multiple levels, including the C4-5 level. Moderate anterior spur formation at the C6-7 level. Upper chest: Clear lung apices. Other: None. IMPRESSION: 1. Right nasal bone fracture with mild superior displacement of the distal fragment. 2. No skull fracture or intracranial hemorrhage. 3. No cervical spine fracture or traumatic subluxation. 4. Right supraorbital and infraorbital soft tissue swelling and right supraorbital soft tissue laceration. 5. Minimal diffuse cerebral and cerebellar atrophy. 6. Mild-to-moderate multilevel cervical spine degenerative changes. Electronically Signed   By: Beckie Salts M.D.   On: 04/28/2017 16:17   Ct Cervical Spine Wo Contrast  Result Date: 04/28/2017 CLINICAL DATA:  Laceration above the right eyebrow after falling while walking. The patient takes low-dose aspirin. EXAM: CT HEAD WITHOUT CONTRAST CT MAXILLOFACIAL WITHOUT CONTRAST CT CERVICAL SPINE WITHOUT CONTRAST TECHNIQUE: Multidetector CT imaging of the head, cervical spine, and maxillofacial structures were performed using  the standard protocol without intravenous contrast. Multiplanar CT image reconstructions of the cervical spine and maxillofacial structures were also generated. COMPARISON:  None. FINDINGS: CT HEAD FINDINGS Brain: Diffusely enlarged ventricles and subarachnoid spaces. No intracranial hemorrhage, mass lesion or CT evidence of acute infarction. Vascular: No hyperdense vessel or unexpected calcification. Skull: Normal. Negative for fracture or focal lesion. Other: None. CT MAXILLOFACIAL FINDINGS Osseous: Right nasal bone fracture with mild superior displacement of the distal fragment Orbits: Negative. No traumatic or inflammatory finding. Sinuses: Clear. Soft tissues: Right supraorbital and infraorbital soft tissue swelling. There is also a right supraorbital soft tissue laceration with overlying bandage material and associated soft tissue air. No radiopaque  foreign body. CT CERVICAL SPINE FINDINGS Alignment: Mild anterolisthesis at the C4-5 level. Skull base and vertebrae: No acute fracture. No primary bone lesion or focal pathologic process. Soft tissues and spinal canal: No prevertebral fluid or swelling. No visible canal hematoma. Disc levels: Multilevel degenerative changes, including facet degenerative changes at multiple levels, including the C4-5 level. Moderate anterior spur formation at the C6-7 level. Upper chest: Clear lung apices. Other: None. IMPRESSION: 1. Right nasal bone fracture with mild superior displacement of the distal fragment. 2. No skull fracture or intracranial hemorrhage. 3. No cervical spine fracture or traumatic subluxation. 4. Right supraorbital and infraorbital soft tissue swelling and right supraorbital soft tissue laceration. 5. Minimal diffuse cerebral and cerebellar atrophy. 6. Mild-to-moderate multilevel cervical spine degenerative changes. Electronically Signed   By: Beckie SaltsSteven  Reid M.D.   On: 04/28/2017 16:17   Dg Hand Complete Right  Result Date: 04/28/2017 CLINICAL DATA:  Right hand pain after fall. EXAM: RIGHT HAND - COMPLETE 3+ VIEW COMPARISON:  None. FINDINGS: Overlying bandages limits characterization of osseous detail, however, there is no fracture line or displaced fracture fragment seen. Overall osseous alignment is within normal limits. No foreign body appreciated within the soft tissues. IMPRESSION: Negative. Electronically Signed   By: Bary RichardStan  Maynard M.D.   On: 04/28/2017 15:49   Ct Maxillofacial Wo Contrast  Result Date: 04/28/2017 CLINICAL DATA:  Laceration above the right eyebrow after falling while walking. The patient takes low-dose aspirin. EXAM: CT HEAD WITHOUT CONTRAST CT MAXILLOFACIAL WITHOUT CONTRAST CT CERVICAL SPINE WITHOUT CONTRAST TECHNIQUE: Multidetector CT imaging of the head, cervical spine, and maxillofacial structures were performed using the standard protocol without intravenous contrast.  Multiplanar CT image reconstructions of the cervical spine and maxillofacial structures were also generated. COMPARISON:  None. FINDINGS: CT HEAD FINDINGS Brain: Diffusely enlarged ventricles and subarachnoid spaces. No intracranial hemorrhage, mass lesion or CT evidence of acute infarction. Vascular: No hyperdense vessel or unexpected calcification. Skull: Normal. Negative for fracture or focal lesion. Other: None. CT MAXILLOFACIAL FINDINGS Osseous: Right nasal bone fracture with mild superior displacement of the distal fragment Orbits: Negative. No traumatic or inflammatory finding. Sinuses: Clear. Soft tissues: Right supraorbital and infraorbital soft tissue swelling. There is also a right supraorbital soft tissue laceration with overlying bandage material and associated soft tissue air. No radiopaque foreign body. CT CERVICAL SPINE FINDINGS Alignment: Mild anterolisthesis at the C4-5 level. Skull base and vertebrae: No acute fracture. No primary bone lesion or focal pathologic process. Soft tissues and spinal canal: No prevertebral fluid or swelling. No visible canal hematoma. Disc levels: Multilevel degenerative changes, including facet degenerative changes at multiple levels, including the C4-5 level. Moderate anterior spur formation at the C6-7 level. Upper chest: Clear lung apices. Other: None. IMPRESSION: 1. Right nasal bone fracture with mild superior displacement of the distal fragment. 2.  No skull fracture or intracranial hemorrhage. 3. No cervical spine fracture or traumatic subluxation. 4. Right supraorbital and infraorbital soft tissue swelling and right supraorbital soft tissue laceration. 5. Minimal diffuse cerebral and cerebellar atrophy. 6. Mild-to-moderate multilevel cervical spine degenerative changes. Electronically Signed   By: Beckie Salts M.D.   On: 04/28/2017 16:17    Procedures Procedures (including critical care time)  Medications Ordered in ED Medications  lidocaine (PF)  (XYLOCAINE) 1 % injection 30 mL (30 mLs Intradermal Given 04/28/17 1630)  Tdap (BOOSTRIX) injection 0.5 mL (0.5 mLs Intramuscular Given 04/28/17 1642)  acetaminophen (TYLENOL) tablet 650 mg (650 mg Oral Given 04/28/17 1658)     Initial Impression / Assessment and Plan / ED Course  I have reviewed the triage vital signs and the nursing notes.  Pertinent labs & imaging results that were available during my care of the patient were reviewed by me and considered in my medical decision making (see chart for details).     This is a 76 year old female who presents emergency Department with fall and lacerations. Her imaging shows a nondisplaced nasal bone fracture. This is not an open fracture. She will be discharged with pain control. Advised patient to follow-up for suture removal with her PCP. She is appropriate for discharge at this time.  Final Clinical Impressions(s) / ED Diagnoses   Final diagnoses:  Fall, initial encounter  Injury of head, initial encounter  Laceration of right eyebrow, initial encounter  Laceration of right hand, foreign body presence unspecified, initial encounter  Closed fracture of nasal bone, initial encounter    New Prescriptions New Prescriptions   TRAMADOL (ULTRAM) 50 MG TABLET    Take 1 tablet (50 mg total) by mouth every 6 (six) hours as needed.     Arthor Captain, PA-C 04/28/17 1848    Samuel Jester, DO 05/02/17 [phone]

## 2017-08-15 ENCOUNTER — Other Ambulatory Visit: Payer: Self-pay | Admitting: Internal Medicine

## 2017-08-15 DIAGNOSIS — Z139 Encounter for screening, unspecified: Secondary | ICD-10-CM

## 2017-09-30 ENCOUNTER — Ambulatory Visit
Admission: RE | Admit: 2017-09-30 | Discharge: 2017-09-30 | Disposition: A | Discharge status: Home / Self Care | Payer: Medicare Other | Source: Ambulatory Visit | Attending: Internal Medicine | Admitting: Internal Medicine

## 2017-09-30 DIAGNOSIS — Z139 Encounter for screening, unspecified: Secondary | ICD-10-CM

## 2018-06-23 DIAGNOSIS — R1310 Dysphagia, unspecified: Secondary | ICD-10-CM | POA: Insufficient documentation

## 2018-06-23 DIAGNOSIS — R490 Dysphonia: Secondary | ICD-10-CM | POA: Insufficient documentation

## 2018-06-23 DIAGNOSIS — J383 Other diseases of vocal cords: Secondary | ICD-10-CM | POA: Insufficient documentation

## 2018-06-24 ENCOUNTER — Other Ambulatory Visit: Payer: Self-pay | Admitting: Otolaryngology

## 2018-06-24 DIAGNOSIS — R1310 Dysphagia, unspecified: Secondary | ICD-10-CM

## 2018-06-30 ENCOUNTER — Other Ambulatory Visit: Payer: Self-pay

## 2018-06-30 ENCOUNTER — Ambulatory Visit: Payer: Medicare Other | Attending: Otolaryngology

## 2018-06-30 DIAGNOSIS — R41841 Cognitive communication deficit: Secondary | ICD-10-CM | POA: Diagnosis present

## 2018-06-30 DIAGNOSIS — R498 Other voice and resonance disorders: Secondary | ICD-10-CM | POA: Insufficient documentation

## 2018-06-30 NOTE — Patient Instructions (Addendum)
      SAY "HUH!" loudly and hold your breath for three seconds.   Do this 10 times, twice a day    We will provide more exercises next time you arrive.   We will work on your breathing style (you were breathing with your chest during conversation) and abdominal breathing is preferred due to being more efficient and natural for the voice.

## 2018-06-30 NOTE — Therapy (Signed)
[facility] [address] [place], [place], [address] Phone: [phone]   Fax:  [phone]  Speech Language Pathology Treatment  Patient Details  Name: Cynthia Jenkins MRN: [id number] Date of Birth: 1940/07/15 Referring Provider (SLP): Misty Stanley, MD   Encounter Date: 06/30/2018  End of Session - 06/30/18 1231    Visit Number  1    Number of Visits  17    Date for SLP Re-Evaluation  09/25/18    SLP Start Time  1022    SLP Stop Time   1105    SLP Time Calculation (min)  43 min    Activity Tolerance  Patient tolerated treatment well       Past Medical History:  Diagnosis Date  . Hypercholesterolemia   . Hypertension   . Situational stress   . Stress fracture    right foot    Past Surgical History:  Procedure Laterality Date  . OTHER SURGICAL HISTORY  1973   hysterectomy  . RECTAL SURGERY      There were no vitals filed for this visit.  Subjective Assessment - 06/30/18 1209    Subjective  "My daughter says my voice is gravelly. My friends didn't notice until I told them I was going to the doctor for my voice."    Currently in Pain?  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 Initial visit with ENT revealed history of GERD poorly controlled. After ENT appointment September 2019 pt with modified med regimen for this, with some diet changes and improved GERD but did made little improvement wiht voice. 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 Pt sent for voice therapy.       Balance Screen   Has the patient  fallen in the past 6 months  No      Prior Functional Status   Cognitive/Linguistic Baseline  Within functional limits    Type of Home  House     Lives With  Spouse      Auditory Comprehension   Overall Auditory Comprehension  Appears within functional limits for tasks assessed      Verbal Expression   Overall Verbal Expression  Appears within functional limits for tasks assessed      Oral Motor/Sensory Function   Overall Oral Motor/Sensory Function  Appears within functional limits for tasks assessed      Motor Speech   Respiration  Impaired   chest & clavicular breating predominant in conversation   Level of Impairment  Sentence    Phonation  --   sustained /a/ average 9.4 seconds (below WNL)   Phonation  Impaired   Voice Related QOL=14 (good to excellent); quality rated FAIR   Vocal Abuses  Other (comment)   cont'd ingestion of foods detrimental to GERD   Pitch  High      Voice case history: Pt reports WNL voice use, although pt is very talkative today during eval. One  cannot rule out nervousness as a factor for hyperverbosity. Pt daily water intake as unknown - pt is to monitor and report to SLP, and caffeine intake is approx one-half mug day, with caffeinated soda consumption at one can x3/week. Onset of voice problem was unknown to patient. In fact, today pt stated that pt's daughter introduced the possibility of voice disorder to pt, and that pt's friends did not remark about a voice problem until she told them she was seeing a MD about her voice.  Voice Related Quality of Life (V-RQOL) score is approx 85 (>80 is WNL). Pt's habitual pitch is perceptually high for age/gender. Maximum Phonation Time (MPT) for /a/ is 10.3  seconds (<12 seconds is suggestive of glottal/respiratory insufficiency). SLP was unable to measure /s/ to /z/ ratio today as pt was unable to make /s/ sound despite max SLP cues. SLP ?s some degree of cognitive impairment as pt also did not complete Voice  Handicap Index after SLP instructions. Vocal quality is judged today as fair to poor - a "dry-sounding hoarseness". Pt told SLP today she is not concerned or bothered about her voice quality, but would like to complete ST in order to see what she can do about improving it. Her score for her V-RQOL (WNL) would corroborate this statement.     SLP Education - 06/30/18 1229    Education Details  Vocal adduction [facility]!) exercise, Importance of H2O and adhering to foods/liquids that do not exacerbate GERD (pt still has some foods in her diet which incr presence of GERD), abdominal vs. chest breathing, why abdominal breathing is preferred, results of eval    Person(s) Educated  Patient    Methods  Explanation;Handout;Demonstration    Comprehension  Verbalized understanding;Need further instruction;Returned demonstration;Verbal cues required;Other (comment)   pt was unsuccessful with vocal adduction exercise today      SLP Short Term Goals - 06/30/18 1406      SLP SHORT TERM GOAL #1   Title  pt will use abdominal breathing in 4/6 minutes (65-70% of the time) at rest, over two sessions    Time  4    Period  Weeks    Status  New      SLP SHORT TERM GOAL #2   Title  pt will use abdominal breathing in 16/20 sentences over two sessions    Time  4    Period  Weeks    Status  New      SLP SHORT TERM GOAL #3   Title  pt will perform vocal strengthening exercises with rare min A over three sessions    Time  4    Period  Weeks    Status  New      SLP SHORT TERM GOAL #4   Title  pt score on Voice-Related QOL will decr to 12    Time  4    Period  Weeks    Status  New      SLP SHORT TERM GOAL #5   Title  pt's maximum phonation time will increase to average 11.0 seconds    Time  4    Period  Weeks    Status  New       SLP Long Term Goals - 06/30/18 1411      SLP LONG TERM GOAL #1   Title  pt score on Voice-Related QOL will decr to 11    Time  8    Period  Weeks   or 17 visits, for  all  LTGs   Status  New      SLP LONG TERM GOAL #2   Title  pt will use abdominal breathing 60% of the time in 8 minutes simple conversation over 3 sessions    Time  8    Period  Weeks    Status  New      SLP LONG TERM GOAL #3   Title  pt will report knowledge of her need to incr daily water consumption to 60 oz.    Time  8    Period  Weeks    Status  New      SLP LONG TERM GOAL #4   Title  pt maximum phonation time will increase to average 14.5 seconds     Time  8    Period  Weeks    Status  New      SLP LONG TERM GOAL #5   Title  pt will perform HEP for vocal strengthening with modified independence over 3 sessions    Time  8    Period  Weeks    Status  New       Plan - 06/30/18 1232    Clinical Impression Statement  Pt presents today with high-pitched, dry sounding voice. She reports limited positive change to her voice quality after a more strict adherence to non-GERD foods/liquids. Unable to produce /s/ for s/z ratio, as well as correctly fill out Voice Handicap Index form correctly after instructions by SLP (? cognitive changes). Pt will benefit from skilled ST to improve pt vocal quality by learning how to master home exercises, as well as learning to master abdominal breathing.     Speech Therapy Frequency  2x / week    Duration  --   8 weeks/17 visits (x2/week x3 weeks then x1/week x4?)   Treatment/Interventions  SLP instruction and feedback;Compensatory strategies;Functional tasks;Internal/external aids;Patient/family education;Other (comment)   Home exercises   Potential to Achieve Goals  Fair    Potential Considerations  Ability to learn/carryover information       Patient will benefit from skilled therapeutic intervention in order to improve the following deficits and impairments:   Other voice and resonance disorders    Problem List Patient Active Problem List   Diagnosis Date Noted  . 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  Hypercholesterolemia   . Situational stress   . Stress fracture     Marlies Ligman ,MS, CCC-SLP  06/30/2018, 2:19 PM  [facility] [address] [place], [place], [address] Phone: [phone]   Fax:  [phone]   Name: Cynthia Jenkins MRN: [id number] Date of Birth: 02/21/1941

## 2018-07-03 ENCOUNTER — Other Ambulatory Visit: Payer: Medicare Other

## 2018-07-07 ENCOUNTER — Other Ambulatory Visit (HOSPITAL_COMMUNITY): Payer: Self-pay

## 2018-07-07 ENCOUNTER — Ambulatory Visit: Payer: Medicare Other

## 2018-07-07 DIAGNOSIS — R41841 Cognitive communication deficit: Secondary | ICD-10-CM

## 2018-07-07 DIAGNOSIS — R498 Other voice and resonance disorders: Secondary | ICD-10-CM

## 2018-07-07 DIAGNOSIS — R1310 Dysphagia, unspecified: Secondary | ICD-10-CM

## 2018-07-07 NOTE — Therapy (Signed)
[facility] [address] [place], [place], [address] Phone: [phone]   Fax:  [phone]  Speech Language Pathology Treatment  Patient Details  Name: Cynthia Jenkins MRN: [id number] Date of Birth: 1941/06/06 Referring Provider (SLP): Misty Stanley, MD   Encounter Date: 07/07/2018  End of Session - 07/07/18 1452    Visit Number  2    Number of Visits  17    Date for SLP Re-Evaluation  09/25/18    SLP Start Time  1319    SLP Stop Time   1400    SLP Time Calculation (min)  41 min    Activity Tolerance  Patient tolerated treatment well       Past Medical History:  Diagnosis Date  . Hypercholesterolemia   . Hypertension   . Situational stress   . Stress fracture    right foot    Past Surgical History:  Procedure Laterality Date  . OTHER SURGICAL HISTORY  1973   hysterectomy  . RECTAL SURGERY      There were no vitals filed for this visit.  Subjective Assessment - 07/07/18 1325    Subjective  Pt enters with mod hoarse voice today.     Currently in Pain?  No/denies            ADULT SLP TREATMENT - 07/07/18 1325      General Information   Behavior/Cognition  Alert;Cooperative;Pleasant mood      Treatment Provided   Treatment provided  Cognitive-Linquistic      Cognitive-Linquistic Treatment   Treatment focused on  Voice    Skilled Treatment  SLP shaped pt's vocal adduction exercise - req'd mod-max cues consistently, faded to min A occasionally. Other exercises (see "pt instructions") were added to HEP - pt copmleted with usual mod A for each exercise faded to modified independent or independent. SLP educated pt on supraglottic and supersupraglottic exercises as well as abdominal breathing (AB) today as well, explained rationale for teaching AB to pt, and using AB in speech. Pt had minimal success in seated position despite SLP max cues consistently with tactile and visual cues so SLP took pt  back to gym area and had pt in supine. Pt began by active protrusion of her abdominal musculature and with min cues to "relax and breathe naturally" acheived abdominal breathing with visual cues (black cone on abdomen). Pt success with AB in this position 90% - SLP told pt to practice AB 15 minutes, BID in supine.       Assessment / Recommendations / Plan   Plan  Continue with current plan of care      Progression Toward Goals   Progression toward goals  Progressing toward goals       SLP Education - 07/07/18 1451    Education Details  HEP for vocal strengthening, abdominal breathing and why this is important in speech    Person(s) Educated  Patient    Methods  Explanation;Demonstration;Tactile cues;Verbal cues;Handout    Comprehension  Verbal cues required;Returned demonstration;Verbalized understanding;Need further instruction;Tactile cues required       SLP Short Term Goals - 07/07/18 1453      SLP SHORT TERM GOAL #1   Title  pt will use abdominal breathing in 4/6 minutes (65-70% of the time) at rest, over two sessions    Time  4    Period  Weeks    Status  On-going      SLP SHORT TERM GOAL #2  Title  pt will use abdominal breathing in 16/20 sentences over two sessions    Time  4    Period  Weeks    Status  On-going      SLP SHORT TERM GOAL #3   Title  pt will perform vocal strengthening exercises with rare min A over three sessions    Time  4    Period  Weeks    Status  On-going      SLP SHORT TERM GOAL #4   Title  pt score on Voice-Related QOL will decr to 12    Time  4    Period  Weeks    Status  On-going      SLP SHORT TERM GOAL #5   Title  pt's maximum phonation time will increase to average 11.0 seconds    Time  4    Period  Weeks    Status  On-going       SLP Long Term Goals - 07/07/18 1453      SLP LONG TERM GOAL #1   Title  pt score on Voice-Related QOL will decr to 11    Time  8    Period  Weeks   or 17 visits, for all LTGs   Status  On-going       SLP LONG TERM GOAL #2   Title  pt will use abdominal breathing 60% of the time in 8 minutes simple conversation over 3 sessions    Time  8    Period  Weeks    Status  On-going      SLP LONG TERM GOAL #3   Title  pt will report knowledge of her need to incr daily water consumption to 60 oz.    Time  8    Period  Weeks    Status  On-going      SLP LONG TERM GOAL #4   Title  pt maximum phonation time will increase to average 14.5 seconds     Time  8    Period  Weeks    Status  On-going      SLP LONG TERM GOAL #5   Title  pt will perform HEP for vocal strengthening with modified independence over 3 sessions    Time  8    Period  Weeks    Status  On-going       Plan - 07/07/18 1452    Clinical Impression Statement  Pt presents today with high-pitched, dry sounding voice moderate hoarseness. Pt was developed a home exercise program today as well as educated about abdomoinal breathing (AB). Pt will benefit from skilled ST to improve pt vocal quality by learning how to master home exercises, as well as learning to master abdominal breathing.     Speech Therapy Frequency  2x / week    Duration  --   8 weeks/17 visits (x2/week x3 weeks then x1/week x4?)   Treatment/Interventions  SLP instruction and feedback;Compensatory strategies;Functional tasks;Internal/external aids;Patient/family education;Other (comment)   Home exercises   Potential to Achieve Goals  Fair    Potential Considerations  Ability to learn/carryover information       Patient will benefit from skilled therapeutic intervention in order to improve the following deficits and impairments:   Other voice and resonance disorders  Cognitive communication deficit    Problem List Patient Active Problem List   Diagnosis Date Noted  . 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 Hypercholesterolemia   .  Situational stress   . Stress fracture     Salam Chesterfield ,MS, CCC-SLP  07/07/2018,  2:53 PM  [facility] [address] [place], [place], [address] Phone: [phone]   Fax:  [phone]   Name: Revonda HumphreyShirley A Zapanta MRN: [id number] Date of Birth: 05/24/1941

## 2018-07-07 NOTE — Patient Instructions (Addendum)
Vocal Fold Adduction Exercises (for voice and swallowing)  . Super-supraglottic swallow o Take a breath & hold it,  o Bear down, swallow, gently cough o 5-7 repetitions, 3x a day  . "HA!" with upward pull o Say "ha!" in a louder voice, pulling up on chair o 5-7 repetitions,  3x a day  . "HA!" with downward push o Say "ha!" in a louder voice, pushing down on a chair o 5-7 repetitions,  3x a day  . Vocal fold adduction o Say "Huh!" with mouth open, & hold your breath for 3 seconds o 10 repetitions, 3x a day  . Supraglottic swallow o Take a breath & hold it o Swallow - gently cough o 5-7 repetitions, 3x a day  These exercises could be done twice a day with increased repetitions (10-12x), if desired.  If voice becomes worse, stop doing these exercises

## 2018-07-09 ENCOUNTER — Ambulatory Visit: Payer: Medicare Other | Attending: Otolaryngology

## 2018-07-09 DIAGNOSIS — R1310 Dysphagia, unspecified: Secondary | ICD-10-CM | POA: Insufficient documentation

## 2018-07-09 DIAGNOSIS — R41841 Cognitive communication deficit: Secondary | ICD-10-CM | POA: Insufficient documentation

## 2018-07-09 DIAGNOSIS — R498 Other voice and resonance disorders: Secondary | ICD-10-CM | POA: Diagnosis present

## 2018-07-09 NOTE — Therapy (Signed)
[facility] [address] [place], [place], [address] Phone: [phone]   Fax:  [phone]  Speech Language Pathology Treatment  Patient Details  Name: Cynthia Jenkins MRN: [id number] Date of Birth: 01/14/1941 Referring Provider (SLP): Misty StanleyMarcellino, Amanda Jo, MD   Encounter Date: 07/09/2018  End of Session - 07/09/18 1624    Visit Number  3    Number of Visits  17    Date for SLP Re-Evaluation  09/25/18    SLP Start Time  1321    SLP Stop Time   1401    SLP Time Calculation (min)  40 min    Activity Tolerance  Patient tolerated treatment well       Past Medical History:  Diagnosis Date  . Hypercholesterolemia   . Hypertension   . Situational stress   . Stress fracture    right foot    Past Surgical History:  Procedure Laterality Date  . OTHER SURGICAL HISTORY  1973   hysterectomy  . RECTAL SURGERY      There were no vitals filed for this visit.  Subjective Assessment - 07/09/18 1324    Subjective  "Coming twoice a week, I don't have time for them.", "My daughter told me mama I don't think you even need to go."    Currently in Pain?  No/denies            ADULT SLP TREATMENT - 07/09/18 1326      General Information   Behavior/Cognition  Alert;Cooperative;Pleasant mood      Treatment Provided   Treatment provided  Cognitive-Linquistic      Cognitive-Linquistic Treatment   Treatment focused on  Voice    Skilled Treatment  Pt enters with mod hoarseness. "I'm doing real good with my breathing." Pt with approx 15% success with abdominal breathing (AB) in seated position. In supine, pt req'd initial mod A usually, then faded to min A occasionally until approx 8 minutes into supine practice of AB. After 5 minutes of AB in supine, SLP had pt practice AB in reclined position with wedge pillow to gradually move pt up to 90 degrees with using AB. SLP told pt to practice AB at least 15 minutes twice a day in a  reclined position (pt has recliner at home to accomplish this). Pt had difficulty regaining AB when asked a question by SLP or when she made sidebar comments, demonstrating lack of habitualization of AB. Pt told SLP her daughter questioned her need for ST so SLP explained to pt that breath support is necessary for proper vocal fold vibration leading to WNL vocal quality, and that this along with vocal exercises will hopefully improve her vocal quality. Pt mentioned again that none of her friends thought anything was wrong with her voice. SLP explained pt's need to cont at x2/week due to inability to achieve AB in seated position.       Assessment / Recommendations / Plan   Plan  Continue with current plan of care      Progression Toward Goals   Progression toward goals  Progressing toward goals       SLP Education - 07/09/18 1623    Education Details  why AB is necessary for therapy for voice, AB in reclined, necessary to do AB 15 minutes twice each day    Person(s) Educated  Patient    Methods  Explanation;Demonstration;Verbal cues;Handout    Comprehension  Verbalized understanding;Returned demonstration;Need further instruction;Verbal cues required  SLP Short Term Goals - 07/09/18 1628      SLP SHORT TERM GOAL #1   Title  pt will use abdominal breathing in 4/6 minutes (65-70% of the time) at rest, over two sessions    Time  3    Period  Weeks    Status  On-going      SLP SHORT TERM GOAL #2   Title  pt will use abdominal breathing in 16/20 sentences over two sessions    Time  3    Period  Weeks    Status  On-going      SLP SHORT TERM GOAL #3   Title  pt will perform vocal strengthening exercises with rare min A over three sessions    Time  3    Period  Weeks    Status  On-going      SLP SHORT TERM GOAL #4   Title  pt score on Voice-Related QOL will decr to 12    Time  3    Period  Weeks    Status  On-going      SLP SHORT TERM GOAL #5   Title  pt's maximum phonation  time will increase to average 11.0 seconds    Time  3    Period  Weeks    Status  On-going       SLP Long Term Goals - 07/09/18 1631      SLP LONG TERM GOAL #1   Title  pt score on Voice-Related QOL will decr to 11    Time  7    Period  Weeks   or 17 visits, for all LTGs   Status  On-going      SLP LONG TERM GOAL #2   Title  pt will use abdominal breathing 60% of the time in 8 minutes simple conversation over 3 sessions    Time  7    Period  Weeks    Status  On-going      SLP LONG TERM GOAL #3   Title  pt will report knowledge of her need to incr daily water consumption to 60 oz.    Time  7    Period  Weeks    Status  On-going      SLP LONG TERM GOAL #4   Title  pt maximum phonation time will increase to average 14.5 seconds     Time  7    Period  Weeks    Status  On-going      SLP LONG TERM GOAL #5   Title  pt will perform HEP for vocal strengthening with modified independence over 3 sessions    Time  7    Period  Weeks    Status  On-going       Plan - 07/09/18 1624    Clinical Impression Statement  Pt presents today with persistent high-pitched, dry sounding voice- moderate hoarseness. SLP questions pt's awareness of voice quality as she told SLP "My voice is better today." SLP reviewed abdominal breathing (AB) with pt - faded cues over time were necessary - pt stated, "I'm doing real good with my breathing!" prior to AB practice. Pt originally wanted to decr to x1/week due to her schedule outside ST, however SLP cautioned her against this but provided the option of pt continuing with HEP and AB as best she could after today. After today's session she agreed will cont to benefit from skilled ST x2/week (at least for next week) to  improve pt vocal quality by learning how to master home exercises, as well as learning to master abdominal breathing. SLP questions pt's committment to making long term changes as pt stated her daughter questioned her need for ST ("It's not about  your breathing" - pt daughter) and friends ("My friends say I've always talked like this") minimizing need for therapeutic intervention.    Speech Therapy Frequency  2x / week    Duration  --   8 weeks/17 visits (x2/week x3 weeks then x1/week x4?)   Treatment/Interventions  SLP instruction and feedback;Compensatory strategies;Functional tasks;Internal/external aids;Patient/family education;Other (comment)   Home exercises   Potential to Achieve Goals  Fair    Potential Considerations  Ability to learn/carryover information       Patient will benefit from skilled therapeutic intervention in order to improve the following deficits and impairments:   Dysphagia, unspecified type  Other voice and resonance disorders  Cognitive communication deficit    Problem List Patient Active Problem List   Diagnosis Date Noted  . 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 Hypercholesterolemia   . Situational stress   . Stress fracture     Arie Powell ,MS, CCC-SLP  07/09/2018, 4:32 PM  [facility] [address] [place], [place], [address] Phone: [phone]   Fax:  [phone]   Name: Cynthia Jenkins MRN: [id number] Date of Birth: 1940-11-12

## 2018-07-09 NOTE — Patient Instructions (Addendum)
   Practice your belly breathing while laying down AT LEAST 15 minutes, AT LEAST twice a day.  Part of the treatment for vocal fold atrophy is breath support and utilizing proper breath support (in the way of abdominal breathing) is essential for proper voice quality.    "Vocal Fold Atrophy" from [facility] re: Breath support and treatment Vocal Fold Atrophy - please see: [email or web address]

## 2018-07-13 ENCOUNTER — Ambulatory Visit: Payer: Medicare Other

## 2018-07-13 DIAGNOSIS — R41841 Cognitive communication deficit: Secondary | ICD-10-CM

## 2018-07-13 DIAGNOSIS — R498 Other voice and resonance disorders: Secondary | ICD-10-CM

## 2018-07-13 DIAGNOSIS — R1310 Dysphagia, unspecified: Secondary | ICD-10-CM | POA: Diagnosis not present

## 2018-07-14 NOTE — Therapy (Signed)
[facility] [address] [place], [place], [address] Phone: [phone]   Fax:  [phone]  Speech Language Pathology Treatment  Patient Details  Name: Cynthia Jenkins MRN: [id number] Date of Birth: [birth year] Referring Provider (SLP): Misty Stanley, MD   Encounter Date: 07/13/2018  End of Session - 07/14/18 1058    Visit Number  4    Number of Visits  17    Date for SLP Re-Evaluation  09/25/18    SLP Start Time  1405    SLP Stop Time   1447    SLP Time Calculation (min)  42 min    Activity Tolerance  Patient tolerated treatment well       Past Medical History:  Diagnosis Date  . Hypercholesterolemia   . Hypertension   . Situational stress   . Stress fracture    right foot    Past Surgical History:  Procedure Laterality Date  . OTHER SURGICAL HISTORY  1973   hysterectomy  . RECTAL SURGERY      There were no vitals filed for this visit.  Subjective Assessment - 07/13/18 1412    Subjective  Pt has reportedly  been completing abdominal breatihng (AB) for recommended time frame "(15 minutes BID).    Currently in Pain?  No/denies            ADULT SLP TREATMENT - 07/14/18 0001      General Information   Behavior/Cognition  Alert;Cooperative;Pleasant mood      Treatment Provided   Treatment provided  Cognitive-Linquistic      Cognitive-Linquistic Treatment   Treatment focused on  Voice    Skilled Treatment  Moderate hoarseness today. Pt stated, "I can do (the abdominal breathing - AB) with just putting my hand on (my abdomen, in seated position) Pt told SLP she had been practicing AB in seated position, but when pt then attempted AB in seated she had no success, without awareness. SLP then worked with pt in seated to achieve AB but pt req'd max A consistently. SLP told pt to move towards gym from private ST room to work on AB, gradually increasing incline from supine, but pt did not get up with  SLP to do so. Thus, SLP cont'd to work with pt for approx 3-4 more minutes with minimal success. SLP told pt to ONLY practice AB in supine and in mostly reclined position. Pt also stated she had been completing her HEP as directed. Pt req'd min-mod cues usually for exercises. SLP ensured pt could perform at least 3 reps without cues from SLP prior to moving to next exercise. SLP reiterated the need for pt to come to ST, and to come for recommended frequency.  Pt was told today it often takes at least 4 weeks for notable success with vocal quality.      Assessment / Recommendations / Plan   Plan  Continue with current plan of care      Progression Toward Goals   Progression toward goals  Progressing toward goals       SLP Education - 07/14/18 1056    Education Details  HEP procedure, abdominal breathing technique, why AB is necessary for voice tx, needs BID HEP completed, AB in supine and reclined only - not in seated    Person(s) Educated  Patient    Methods  Explanation;Handout;Demonstration;Verbal cues    Comprehension  Verbalized understanding;Returned demonstration;Verbal cues required;Need further instruction       SLP Short  Term Goals - 07/14/18 1102      SLP SHORT TERM GOAL #1   Title  pt will use abdominal breathing in 4/6 minutes (65-70% of the time) at rest, over two sessions    Time  2    Period  Weeks    Status  On-going      SLP SHORT TERM GOAL #2   Title  pt will use abdominal breathing in 16/20 sentences over two sessions    Time  2    Period  Weeks    Status  On-going      SLP SHORT TERM GOAL #3   Title  pt will perform vocal strengthening exercises with rare min A over three sessions    Time  2    Period  Weeks    Status  On-going      SLP SHORT TERM GOAL #4   Title  pt score on Voice-Related QOL will decr to 12    Time  2    Period  Weeks    Status  On-going      SLP SHORT TERM GOAL #5   Title  pt's maximum phonation time will increase to average 11.0  seconds    Time  2    Period  Weeks    Status  On-going       SLP Long Term Goals - 07/14/18 1102      SLP LONG TERM GOAL #1   Title  pt score on Voice-Related QOL will decr to 11    Time  6    Period  Weeks   or 17 visits, for all LTGs   Status  On-going      SLP LONG TERM GOAL #2   Title  pt will use abdominal breathing 60% of the time in 8 minutes simple conversation over 3 sessions    Time  6    Period  Weeks    Status  On-going      SLP LONG TERM GOAL #3   Title  pt will report knowledge of her need to incr daily water consumption to 60 oz.    Time  6    Period  Weeks    Status  On-going      SLP LONG TERM GOAL #4   Title  pt maximum phonation time will increase to average 14.5 seconds     Time  6    Period  Weeks    Status  On-going      SLP LONG TERM GOAL #5   Title  pt will perform HEP for vocal strengthening with modified independence over 3 sessions    Time  6    Period  Weeks    Status  On-going       Plan - 07/14/18 1058    Clinical Impression Statement  Pt presents today with persistent high-pitched, dry sounding voice- moderate hoarseness. SLP questions pt's accuracy of completion of abdominal breathing (AB) and of HEP at home as pt stated she had success with both of these but req'd SLP cues for both things. SLP used this to emphasize why x2/week was importatnt. Pt would cont to benefit from skilled ST for abdominal breathing (AB) and for vocal strengthening exercises.    Speech Therapy Frequency  2x / week    Duration  --   8 weeks/17 visits (x2/week x3 weeks then x1/week x4?)   Treatment/Interventions  SLP instruction and feedback;Compensatory strategies;Functional tasks;Internal/external aids;Patient/family education;Other (comment)   Home  exercises   Potential to Achieve Goals  Fair    Potential Considerations  Ability to learn/carryover information       Patient will benefit from skilled therapeutic intervention in order to improve the  following deficits and impairments:   Other voice and resonance disorders  Cognitive communication deficit    Problem List Patient Active Problem List   Diagnosis Date Noted  . 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 Hypercholesterolemia   . Situational stress   . Stress fracture     , ,MS, CCC-SLP  07/14/2018, 11:03 AM  [place] [facility] [address] [place], [place], [address] Phone: [phone]   Fax:  [phone]   Name: MARSHAWNA LIPPITT MRN: [id number] Date of Birth: Sep 03, 1940

## 2018-07-14 NOTE — Patient Instructions (Signed)
Do your vocal strengthening exercises TWICE each day. Look at your paper like you did today and you have a better chance at perofmring the exercises correctly.   Practice abdominal breathing while lying down or reclined. Don't practice sitting up just yet.

## 2018-07-17 ENCOUNTER — Ambulatory Visit: Payer: Medicare Other

## 2018-07-17 DIAGNOSIS — R41841 Cognitive communication deficit: Secondary | ICD-10-CM

## 2018-07-17 DIAGNOSIS — R1310 Dysphagia, unspecified: Secondary | ICD-10-CM

## 2018-07-17 DIAGNOSIS — R498 Other voice and resonance disorders: Secondary | ICD-10-CM

## 2018-07-17 NOTE — Patient Instructions (Signed)
KEEP PRACTICING! 

## 2018-07-17 NOTE — Therapy (Signed)
[facility] [address] [place], [place], [address] Phone: [phone]   Fax:  [phone]  Speech Language Pathology Treatment  Patient Details  Name: Cynthia HumphreyShirley A Chern MRN: [id number] Date of Birth: 07/14/1940 Referring Provider (SLP): Misty StanleyMarcellino, Amanda Jo, MD   Encounter Date: 07/17/2018  End of Session - 07/17/18 1409    Visit Number  5    Number of Visits  17    Date for SLP Re-Evaluation  09/25/18    SLP Start Time  1318    SLP Stop Time   1402    SLP Time Calculation (min)  44 min    Activity Tolerance  Patient tolerated treatment well       Past Medical History:  Diagnosis Date  . Hypercholesterolemia   . Hypertension   . Situational stress   . Stress fracture    right foot    Past Surgical History:  Procedure Laterality Date  . OTHER SURGICAL HISTORY  1973   hysterectomy  . RECTAL SURGERY      There were no vitals filed for this visit.  Subjective Assessment - 07/17/18 1339    Subjective  "Oh it's going much better." (pt, re: her abdominal breathing)  "We're on the tail end of our therapy." (pt)    Currently in Pain?  No/denies            ADULT SLP TREATMENT - 07/17/18 1330      General Information   Behavior/Cognition  Alert;Cooperative;Pleasant mood      Treatment Provided   Treatment provided  Cognitive-Linquistic      Cognitive-Linquistic Treatment   Treatment focused on  Voice    Skilled Treatment  Pt's voice appears unchanged from previous session. Pt drinking diet caffeinated beverage; SLP told pt that decaf would have been a better choice for her voice quality. SLP took pt to private treatment room (pt reported feeling subconscious about a mat table in the main room in prevous visits), and practiced abdominal breathing (AB) in reclined (approx 30 degrees) for 3 minutes with 90%+ success.  SLP raised the recline to approx 50 degrees and pt with success approx 90% with AB. At  approx 60 degrees pt with 90+% success. In sitting, pt produced AB 90%, SLP had pt progress to producing vocalization ("mamama", ""moon", "new moon"). Pt with limited success with improved voice > 15% of the time with these nasal/vowel words/syllables (mostly at intital point in the utterances). SLP encouraged pt to cont to practice - and hopefully will see success week after next with her vocal quality.       Assessment / Recommendations / Plan   Plan  Continue with current plan of care      Progression Toward Goals   Progression toward goals  Progressing toward goals       SLP Education - 07/17/18 1409    Education Details  might take 4-6 weeks before we see noticed change in her voice    Person(s) Educated  Patient    Methods  Explanation    Comprehension  Verbalized understanding       SLP Short Term Goals - 07/17/18 1416      SLP SHORT TERM GOAL #1   Title  pt will use abdominal breathing in 4/6 minutes (65-70% of the time) at rest, over two sessions    Baseline  07-17-18    Time  2    Period  Weeks    Status  On-going  SLP SHORT TERM GOAL #2   Title  pt will use abdominal breathing in 16/20 sentences over two sessions    Time  2    Period  Weeks    Status  On-going      SLP SHORT TERM GOAL #3   Title  pt will perform vocal strengthening exercises with rare min A over three sessions    Time  2    Period  Weeks    Status  On-going      SLP SHORT TERM GOAL #4   Title  pt score on Voice-Related QOL will decr to 12    Time  2    Period  Weeks    Status  On-going      SLP SHORT TERM GOAL #5   Title  pt's maximum phonation time will increase to average 11.0 seconds    Time  2    Period  Weeks    Status  On-going       SLP Long Term Goals - 07/17/18 1419      SLP LONG TERM GOAL #1   Title  pt score on Voice-Related QOL will decr to 11    Time  6    Period  Weeks   or 17 visits, for all LTGs   Status  On-going      SLP LONG TERM GOAL #2   Title  pt will  use abdominal breathing 60% of the time in 8 minutes simple conversation over 3 sessions    Time  6    Period  Weeks    Status  On-going      SLP LONG TERM GOAL #3   Title  pt will report knowledge of her need to incr daily water consumption to 60 oz.    Time  6    Period  Weeks    Status  On-going      SLP LONG TERM GOAL #4   Title  pt maximum phonation time will increase to average 14.5 seconds     Time  6    Period  Weeks    Status  On-going      SLP LONG TERM GOAL #5   Title  pt will perform HEP for vocal strengthening with modified independence over 3 sessions    Time  6    Period  Weeks    Status  On-going       Plan - 07/17/18 1410    Clinical Impression Statement Abdominal breathing (AB) is much more accurate than last session and pt able to complete in seated position today at 90%, yet with minimal success in voice improvemetn, with nasal/vowel words and phrases (see "skilled intervention" for details).  Pt presents today with persistent high-pitched, dry sounding voice- moderate hoarseness.  Pt would cont to benefit from skilled ST for abdominal breathing (AB) and for vocal strengthening exercises.    Speech Therapy Frequency  2x / week    Duration  --   8 weeks/17 visits (x2/week x3 weeks then x1/week x4?)   Treatment/Interventions  SLP instruction and feedback;Compensatory strategies;Functional tasks;Internal/external aids;Patient/family education;Other (comment)   Home exercises   Potential to Achieve Goals  Fair    Potential Considerations  Ability to learn/carryover information       Patient will benefit from skilled therapeutic intervention in order to improve the following deficits and impairments:   Cognitive communication deficit  Other voice and resonance disorders  Dysphagia, unspecified type    Problem List  Patient Active Problem List   Diagnosis Date Noted  . 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  Hypercholesterolemia   . Situational stress   . Stress fracture     Diamonds Lippard ,MS, CCC-SLP  07/17/2018, 2:24 PM  [place] [facility] [address] [place], [place], [address] Phone: [phone]   Fax:  [phone]   Name: GARNETTA DARCO MRN: [id number] Date of Birth: 02-Nov-1940

## 2018-07-20 ENCOUNTER — Ambulatory Visit: Payer: Medicare Other

## 2018-07-20 DIAGNOSIS — R1310 Dysphagia, unspecified: Secondary | ICD-10-CM

## 2018-07-20 DIAGNOSIS — R498 Other voice and resonance disorders: Secondary | ICD-10-CM

## 2018-07-20 DIAGNOSIS — R41841 Cognitive communication deficit: Secondary | ICD-10-CM

## 2018-07-20 NOTE — Therapy (Signed)
[facility] [address] [place], [place], [address] Phone: [phone]   Fax:  [phone]  Speech Language Pathology Treatment  Patient Details  Name: Cynthia Jenkins MRN: [id number] Date of Birth: 04/13/1941 Referring Provider (SLP): Misty StanleyMarcellino, Amanda Jo, MD   Encounter Date: 07/20/2018  End of Session - 07/20/18 1441    Visit Number  6    Number of Visits  17    Date for SLP Re-Evaluation  09/25/18    SLP Start Time  1318    SLP Stop Time   1400    SLP Time Calculation (min)  42 min    Activity Tolerance  Patient tolerated treatment well       Past Medical History:  Diagnosis Date  . Hypercholesterolemia   . Hypertension   . Situational stress   . Stress fracture    right foot    Past Surgical History:  Procedure Laterality Date  . OTHER SURGICAL HISTORY  1973   hysterectomy  . RECTAL SURGERY      There were no vitals filed for this visit.  Subjective Assessment - 07/20/18 1321    Subjective  "We're going to need to cancel Thursday because I have (the modified barium swallow)."     Currently in Pain?  No/denies            ADULT SLP TREATMENT - 07/20/18 1322      General Information   Behavior/Cognition  Alert;Cooperative;Pleasant mood      Treatment Provided   Treatment provided  Cognitive-Linquistic      Cognitive-Linquistic Treatment   Treatment focused on  Voice    Skilled Treatment  Pt voice sounds much like previous sessions. SLP offered three times to redschedule her Thursday appointment and pt declined all three choices. "I did these since last time - I'm doing better." (re: HEP). Pt again req'd cues from SLP for correct completion - pt req'd mod A usually faded to mod A rarely with supraglottic, and mod A usually with super-supraglottic swallow (SLP was unable to fade cues due to pt necessity for correct completion). SLP explained to pt what modified barium swallow exam is and what  it will assess. Pt filled out Voice-Related Quality of Life (VR-QOL) and looked to SLP which number to circle, for the first three items; SLP reminded pt these items are what SHE thinks about her voice, not what SLP thinks. She rated her voice as sounding "very good", and her total rating was 11. Pt's maximum phonation time remained essentially unchanged, at average 10.5 seconds.      Assessment / Recommendations / Plan   Plan  Continue with current plan of care      Progression Toward Goals   Progression toward goals  Not progressing toward goals (comment)   voice sounds unchanged; procedure with HEP questionable      SLP Education - 07/20/18 1441    Education Details  what modified barium swallow exam is, HEP procedure    Person(s) Educated  Patient    Methods  Explanation;Demonstration;Verbal cues    Comprehension  Verbalized understanding;Returned demonstration;Need further instruction;Verbal cues required       SLP Short Term Goals - 07/20/18 1318      SLP SHORT TERM GOAL #1   Title  pt will use abdominal breathing in 4/6 minutes (65-70% of the time) at rest, over two sessions    Baseline  --    Time  --    Period  --  Status  Achieved      SLP SHORT TERM GOAL #2   Title  pt will use abdominal breathing in 16/20 vocalizations over two sessions    Time  1    Period  Weeks    Status  Revised      SLP SHORT TERM GOAL #3   Title  pt will perform vocal strengthening exercises with occasional min-mod A over three sessions    Time  1    Period  Weeks    Status  Revised      SLP SHORT TERM GOAL #4   Title  pt score on Voice-Related QOL will decr to 12    Time  --    Period  --    Status  Achieved   11     SLP SHORT TERM GOAL #5   Title  pt's maximum phonation time will increase to average 11.0 seconds    Time  2    Period  Weeks    Status  On-going       SLP Long Term Goals - 07/20/18 1446      SLP LONG TERM GOAL #1   Title  pt score on Voice-Related QOL will  decr to 11    Period  --   or 17 visits, for all LTGs   Status  Achieved      SLP LONG TERM GOAL #2   Title  pt will use abdominal breathing 50% of the time in 5 minutes simple conversation over 3 sessions    Time  5    Period  Weeks    Status  Revised      SLP LONG TERM GOAL #3   Title  pt will report knowledge of her need to incr daily water consumption to 60 oz.    Time  5    Period  Weeks    Status  On-going      SLP LONG TERM GOAL #4   Title  pt maximum phonation time will increase to average 13.5 seconds     Time  5    Period  Weeks    Status  Revised      SLP LONG TERM GOAL #5   Title  pt will perform HEP for vocal strengthening with rare min A over 3 sessions    Time  6    Period  Weeks    Status  Revised       Plan - 07/20/18 1442    Clinical Impression Statement  Pt presents today with persistent high-pitched, dry sounding voice- cont'd moderate hoarseness. Abdominal breathing (AB) is attempted with vocalization ("mamamama", "new-new-new-new") today with 70% success. Pt able to complete AB in seated position today at 90%. Pt cont to tell SLP that her performance with HEP is "better" however pt requires cont'd cues for correct procedure. Pt would cont to benefit from skilled ST at twice a week for abdominal breathing (AB) and for vocal strengthening exercises.    Speech Therapy Frequency  2x / week    Duration  --   8 weeks/17 visits (x2/week x3 weeks then x1/week x4?)   Treatment/Interventions  SLP instruction and feedback;Compensatory strategies;Functional tasks;Internal/external aids;Patient/family education;Other (comment)   Home exercises   Potential to Achieve Goals  Fair    Potential Considerations  Ability to learn/carryover information       Patient will benefit from skilled therapeutic intervention in order to improve the following deficits and impairments:   Other voice and  resonance disorders  Cognitive communication deficit  Dysphagia, unspecified  type    Problem List Patient Active Problem List   Diagnosis Date Noted  . 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 Hypercholesterolemia   . Situational stress   . Stress fracture     Javelle Donigan ,MS, CCC-SLP  07/20/2018, 2:48 PM  [facility] [address] [place], [place], [address] Phone: [phone]   Fax:  [phone]   Name: Cynthia HumphreyShirley A Jenkins MRN: [id number] Date of Birth: 10/17/1940

## 2018-07-23 ENCOUNTER — Encounter (HOSPITAL_COMMUNITY): Payer: Medicare Other

## 2018-07-23 ENCOUNTER — Ambulatory Visit: Payer: Medicare Other

## 2018-07-23 ENCOUNTER — Ambulatory Visit (HOSPITAL_COMMUNITY)
Admission: RE | Admit: 2018-07-23 | Discharge: 2018-07-23 | Disposition: A | Discharge status: Home / Self Care | Payer: Medicare Other | Source: Ambulatory Visit | Attending: Otolaryngology | Admitting: Otolaryngology

## 2018-07-23 ENCOUNTER — Ambulatory Visit (HOSPITAL_COMMUNITY): Payer: Medicare Other

## 2018-07-23 DIAGNOSIS — R1310 Dysphagia, unspecified: Secondary | ICD-10-CM | POA: Diagnosis not present

## 2018-07-23 DIAGNOSIS — R498 Other voice and resonance disorders: Secondary | ICD-10-CM | POA: Insufficient documentation

## 2018-07-23 NOTE — Progress Notes (Signed)
Objective Swallowing Evaluation: Type of Study: MBS-Modified Barium Swallow Study   Patient Details  Name: Cynthia Jenkins MRN: [id number] Date of Birth: 1941/02/13  Today's Date: 07/23/2018 Time: SLP Start Time (ACUTE ONLY): 1050 -SLP Stop Time (ACUTE ONLY): 1110  SLP Time Calculation (min) (ACUTE ONLY): 20 min   Past Medical History:  Past Medical History:  Diagnosis Date  . Hypercholesterolemia   . Hypertension   . Situational stress   . Stress fracture    right foot   Past Surgical History:  Past Surgical History:  Procedure Laterality Date  . OTHER SURGICAL HISTORY  1973   hysterectomy  . RECTAL SURGERY     HPI: Pt with hoarseness made aware to her by family in fall 2019. Initial visit with ENT revealed history of GERD poorly controlled. After ENT appointment September 2019 pt with modified med regimen for this, with some diet changes and improved GERD but did made little improvement with voice. 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 Pt has been attending voice therapy, reports feeling of pills sticking. Denies other difficulty swallowing.      No data recorded   Assessment / Plan / Recommendation  CHL IP CLINICAL IMPRESSIONS 07/23/2018  Clinical Impression Pt demonstrates swallow within normal limits for age. She reported practicing a super/supraglottic swallow in therapy, but did not carry it over in assessment. Pt typically triggers swallow at level of vallecuale or pyriforms but airway protection was adequate and no penetration or aspiration occurred. 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 No diet modification needed.   SLP Visit Diagnosis Dysphagia, unspecified (R13.10)  Attention and concentration deficit following --  Frontal lobe and executive function deficit following --  Impact on safety and function Mild aspiration  risk      CHL IP TREATMENT RECOMMENDATION 07/23/2018  Treatment Recommendations Defer treatment plan to f/u with SLP     No flowsheet data found.  CHL IP DIET RECOMMENDATION 07/23/2018  SLP Diet Recommendations Regular solids;Thin liquid  Liquid Administration via Cup;Straw  Medication Administration Whole meds with liquid  Compensations --  Postural Changes Seated upright at 90 degrees;Remain semi-upright after after feeds/meals (Comment)      No flowsheet data found.    CHL IP FOLLOW UP RECOMMENDATIONS 07/23/2018  Follow up Recommendations Outpatient SLP      No flowsheet data found.         CHL IP ORAL PHASE 07/23/2018  Oral Phase WFL  Oral - Pudding Teaspoon --  Oral - Pudding Cup --  Oral - Honey Teaspoon --  Oral - Honey Cup --  Oral - Nectar Teaspoon --  Oral - Nectar Cup --  Oral - Nectar Straw --  Oral - Thin Teaspoon --  Oral - Thin Cup --  Oral - Thin Straw --  Oral - Puree --  Oral - Mech Soft --  Oral - Regular --  Oral - Multi-Consistency --  Oral - Pill --  Oral Phase - Comment --    CHL IP PHARYNGEAL PHASE 07/23/2018  Pharyngeal Phase WFL  Pharyngeal- Pudding Teaspoon --  Pharyngeal --  Pharyngeal- Pudding Cup --  Pharyngeal --  Pharyngeal- Honey Teaspoon --  Pharyngeal --  Pharyngeal- Honey Cup --  Pharyngeal --  Pharyngeal- Nectar Teaspoon --  Pharyngeal --  Pharyngeal- Nectar Cup --  Pharyngeal --  Pharyngeal- Nectar Straw --  Pharyngeal --  Pharyngeal- Thin Teaspoon --  Pharyngeal --  Pharyngeal- Thin Cup --  Pharyngeal --  Pharyngeal- Thin Straw --  Pharyngeal --  Pharyngeal- Puree --  Pharyngeal --  Pharyngeal- Mechanical Soft --  Pharyngeal --  Pharyngeal- Regular --  Pharyngeal --  Pharyngeal- Multi-consistency --  Pharyngeal --  Pharyngeal- Pill --  Pharyngeal --  Pharyngeal Comment --     No flowsheet data found.  Harlon DittyBonnie Brayli Klingbeil, MA CCC-SLP  Acute Rehabilitation Services Pager [phone] Office  [phone]  Claudine MoutonDeBlois, Crystol [place] Caroline 07/23/2018, 11:28 AM

## 2018-07-28 ENCOUNTER — Ambulatory Visit: Payer: Medicare Other

## 2018-08-06 ENCOUNTER — Ambulatory Visit: Payer: Medicare Other

## 2018-08-06 DIAGNOSIS — R1310 Dysphagia, unspecified: Secondary | ICD-10-CM | POA: Diagnosis not present

## 2018-08-06 DIAGNOSIS — R41841 Cognitive communication deficit: Secondary | ICD-10-CM

## 2018-08-06 DIAGNOSIS — R498 Other voice and resonance disorders: Secondary | ICD-10-CM

## 2018-08-07 NOTE — Patient Instructions (Signed)
Exercises must be completed daily, twice to three times, for the best opportunity for improvement with your voice.

## 2018-08-07 NOTE — Therapy (Signed)
[facility] [address] [place], [place], [address] Phone: [phone]   Fax:  [phone]  Speech Language Pathology Treatment/Discharge Summary  Patient Details  Name: Cynthia Jenkins MRN: [id number] Date of Birth: 06/19/1941 Referring Provider (SLP): Gavin Pound, MD   Encounter Date: 08/06/2018  End of Session - 08/07/18 0850    Visit Number  7    Number of Visits  17    Date for SLP Re-Evaluation  09/25/18    SLP Start Time  72    SLP Stop Time   1400    SLP Time Calculation (min)  42 min    Activity Tolerance  Patient tolerated treatment well       Past Medical History:  Diagnosis Date  . Hypercholesterolemia   . Hypertension   . Situational stress   . Stress fracture    right foot    Past Surgical History:  Procedure Laterality Date  . [place]   hysterectomy  . RECTAL SURGERY      There were no vitals filed for this visit.  Subjective Assessment - 08/07/18 0841    Subjective  "My voice is better. I think I'm doing good." (SLP notes minimal difference from eval in pt's vocal quality). Pt brings caffeinated beverage from fast food into ST room today (? pt adherence to vocal hygiene measures)    Currently in Pain?  No/denies            ADULT SLP TREATMENT - 08/07/18 0001      General Information   Behavior/Cognition  Alert;Cooperative;Pleasant mood      Treatment Provided   Treatment provided  Cognitive-Linquistic      Cognitive-Linquistic Treatment   Treatment focused on  Voice    Skilled Treatment  Pt told SLP she was upset that nobody called her with results of the modified barium swallow (MBSS). SLP questioned if pt rec'd education about MBSS results from evaluating SLP, pt stated "She said, 'It looks good.'" Pt did not bring her HEP with her - pt admitted she has been noncompliant with frequency of HEP. SLP printed off a copy of HEP and pt completed with  SLP needing to provide consistent verbal, visual, and demonstration cues for 4 of 5 exercises. Given the necessity for level and nature of SLP cues with her HEP, SLP continues to question pt's adherence to HEP/consistent completion at home. SLP again reiterated to pt that she needed to practice these exercises twice to three times daily to afford herself best possible opportunity for improved vocal quality. Pt then said "S" statement (her voice was better). SLP asked pt if she was ready for discharge and pt said she was ready. Max sustained /a/ was average 10.5 seconds today (10.3 seconds at eval). Pt used abdominal breathing (AB) at rest in seated position with 100% success, however in conversation pt using approx 60%. Pt reports she is happy with that.       Assessment / Recommendations / Plan   Plan  Discharge SLP treatment due to (comment)   pt request; pt's decr'd committment for change     Progression Toward Goals   Progression toward goals  Not progressing toward goals (comment)       SLP Education - 08/07/18 6962    Education Details  HEP procedure, HEP frequency    Methods  Explanation;Demonstration;Verbal cues;Handout    Comprehension  Verbalized understanding;Returned demonstration;Verbal cues required;Need further instruction  SLP Short Term Goals - 08/07/18 0856      SLP SHORT TERM GOAL #1   Title  pt will use abdominal breathing in 4/6 minutes (65-70% of the time) at rest, over two sessions    Status  Achieved      SLP SHORT TERM GOAL #2   Title  pt will use abdominal breathing in 16/20 vocalizations over two sessions    Status  Not Met      SLP SHORT TERM GOAL #3   Title  pt will perform vocal strengthening exercises with occasional min-mod A over three sessions    Status  Not Met      SLP SHORT TERM GOAL #4   Title  pt score on Voice-Related QOL will decr to 12    Status  Achieved   11     SLP SHORT TERM GOAL #5   Title  pt's maximum phonation time will  increase to average 11.0 seconds    Status  Not Met       SLP Long Term Goals - 08/06/18 1349      SLP LONG TERM GOAL #1   Title  pt score on Voice-Related QOL will decr to 11    Period  --   or 17 visits, for all LTGs   Status  Achieved      SLP LONG TERM GOAL #2   Title  pt will use abdominal breathing 50% of the time in 5 minutes simple conversation over 3 sessions    Baseline  08-06-18    Time  --    Period  --    Status  Partially Met   1/3 sessions     SLP LONG TERM GOAL #3   Title  pt will report knowledge of her need to incr daily water consumption to 60 oz.    Time  --    Period  --    Status  Not Met      SLP LONG TERM GOAL #4   Title  pt maximum phonation time will increase to average 13.5 seconds     Time  --    Period  --    Status  Not Met      SLP LONG TERM GOAL #5   Title  pt will perform HEP for vocal strengthening with rare min A over 3 sessions    Time  --    Period  --    Status  Not Met       Plan - 08/07/18 0851    Clinical Impression Statement  Pt presents today with voice which sounds primarily unchanged from evaluation- cont'd moderate hoarseness with intermittent high-pitched dry voice. Pt noncompliant with HEP frequency which consistently leads to req'd SLP cues on procedure. After 4-5 visits with consistent SLP cues necessary for HEP procedure and her "s" statements about her perceived improvement in voice quality, SLP questions pt's committment for positive change. Abdominal breathing (AB) is used in conversation with approx 60% success and pt reports she is satisfied with this. Pt will be d/c'd at this time due to pt request (see skilled intervention for details), and pt's actions suggesting decr'd committment for positive change to vocal quality.     Speech Therapy Frequency  2x / week    Duration  --   8 weeks/17 visits (x2/week x3 weeks then x1/week x4?)   Treatment/Interventions  SLP instruction and feedback;Compensatory  strategies;Functional tasks;Internal/external aids;Patient/family education;Other (comment)   Home exercises  Potential to Achieve Goals  Fair    Potential Considerations  Ability to learn/carryover information       Patient will benefit from skilled therapeutic intervention in order to improve the following deficits and impairments:   Other voice and resonance disorders  Cognitive communication deficit   SPEECH THERAPY DISCHARGE SUMMARY  Visits from Start of Care: 7  Current functional level related to goals / functional outcomes: Pt successful with abdominal breathing/breath support in seated position, uses approx 60% in conversation. Pt with minimal perceptual difference with vocal quality from evaluation. Entered most sessions saying her voice had improved from the previous session. Pt admitted noncompliance with vocal fold strengthening exercises.  See goal update for more details.    Remaining deficits: Vocal quality remains hoarse with intermittent dry/squeaky quality.    Education / Equipment: HEP procedure every session after pt was begun with her vocal strengthening HEP, vocal hygiene measures, abdominal breathing, necessity to perform HEP as prescribed.   Plan: Patient agrees to discharge.  Patient goals were partially met. Patient is being discharged due to the patient's request.  ?????and decr'd pt motivation for change.      Problem List Patient Active Problem List   Diagnosis Date Noted  . 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 Hypercholesterolemia   . Situational stress   . Stress fracture     Luverta Korte ,MS, CCC-SLP  08/07/2018, 9:00 AM  [facility] [address] [place], [place], [address] Phone: [phone]   Fax:  [phone]   Name: BRYLEIGH OTTAWAY MRN: [id number] Date of Birth: 02/08/1941

## 2018-08-13 ENCOUNTER — Ambulatory Visit: Payer: Medicare Other

## 2018-09-07 ENCOUNTER — Other Ambulatory Visit: Payer: Self-pay | Admitting: Internal Medicine

## 2018-09-07 DIAGNOSIS — Z1231 Encounter for screening mammogram for malignant neoplasm of breast: Secondary | ICD-10-CM

## 2018-09-11 ENCOUNTER — Encounter: Payer: Self-pay | Admitting: Podiatry

## 2018-09-11 ENCOUNTER — Ambulatory Visit: Payer: Medicare Other | Admitting: Podiatry

## 2018-09-11 DIAGNOSIS — M79675 Pain in left toe(s): Secondary | ICD-10-CM

## 2018-09-11 DIAGNOSIS — M79674 Pain in right toe(s): Secondary | ICD-10-CM

## 2018-09-11 DIAGNOSIS — B351 Tinea unguium: Secondary | ICD-10-CM

## 2018-09-11 DIAGNOSIS — L84 Corns and callosities: Secondary | ICD-10-CM | POA: Diagnosis not present

## 2018-09-11 NOTE — Patient Instructions (Signed)

## 2018-09-22 ENCOUNTER — Encounter: Payer: Self-pay | Admitting: Podiatry

## 2018-09-22 NOTE — Progress Notes (Signed)
Subjective: Cynthia Jenkins presents today referred by Chilton Greathouse, MD with cc of painful, discolored, thick toenails which have been present for years. Pain interferes with daily activities.  Pain is aggravated when wearing enclosed shoe gear.   Patient also complains of lesion between fourth and fifth toes of the left foot as well as a lesion on the bottom of her left foot.  Both have been present for several years.  She has peeled it off in the past.  Patient states she really cannot see between her toes but she knows that something is there.  She denies any drainage or swelling of the area.  This these lesions to interfere with her ambulation.   Past Medical History:  Diagnosis Date  . Hypercholesterolemia   . Hypertension   . Situational stress   . Stress fracture    right foot     Patient Active Problem List   Diagnosis Date Noted  . 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 Dysphonia 06/23/2018  . Pill dysphagia 06/23/2018  . 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 Hypercholesterolemia   . Situational stress   . Stress fracture      Past Surgical History:  Procedure Laterality Date  . OTHER SURGICAL HISTORY  1973   hysterectomy  . RECTAL SURGERY        Current Outpatient Medications:  .  omeprazole (PRILOSEC) 40 MG capsule, omeprazole 40 mg capsule,delayed release, Disp: , Rfl:  .  amLODipine (NORVASC) 5 MG tablet, amlodipine 5 mg tablet, Disp: , Rfl:  .  aspirin 81 MG tablet, Take 81 mg by mouth daily.  , Disp: , Rfl:  .  Aspirin-Calcium Carbonate 81-777 MG TABS, Take by mouth., Disp: , Rfl:  .  buPROPion (WELLBUTRIN XL) 150 MG 24 hr tablet, Take 150 mg by mouth daily., Disp: , Rfl: 0 .  carvedilol (COREG) 12.5 MG tablet, Take 12.5 mg by mouth 2 (two) times daily with a meal., Disp: , Rfl:  .  Cholecalciferol (VITAMIN D PO), Take 1 tablet by mouth daily. , Disp: , Rfl:  .  Cyanocobalamin (VITAMIN B 12 PO), Take 1 tablet by mouth  daily. , Disp: , Rfl:  .  fluticasone (FLONASE) 50 MCG/ACT nasal spray, , Disp: , Rfl:  .  guaiFENesin (MUCINEX) 600 MG 12 hr tablet, Take 1 tablet (600 mg total) by mouth 2 (two) times daily as needed for congestion. (Patient not taking: Reported on 04/28/2017), Disp: , Rfl:  .  loratadine (CLARITIN) 10 MG tablet, Allergy Relief (loratadine) 10 mg tablet, Disp: , Rfl:  .  losartan (COZAAR) 100 MG tablet, , Disp: , Rfl:  .  losartan-hydrochlorothiazide (HYZAAR) 100-12.5 MG per tablet, Take 1 tablet by mouth daily., Disp: 90 tablet, Rfl: 3 .  metroNIDAZOLE (METROGEL) 0.75 % gel, , Disp: , Rfl:  .  Omega-3 Fatty Acids (FISH OIL PO), Take 1 capsule by mouth daily. , Disp: , Rfl:  .  Omeprazole (PRILOSEC PO), Take 1 tablet by mouth daily. , Disp: , Rfl:  .  ondansetron (ZOFRAN-ODT) 8 MG disintegrating tablet, ondansetron 8 mg disintegrating tablet, Disp: , Rfl:  .  pravastatin (PRAVACHOL) 40 MG tablet, Take 1 tablet (40 mg total) by mouth daily., Disp: 90 tablet, Rfl: 3 .  traMADol (ULTRAM) 50 MG tablet, Take 1 tablet (50 mg total) by mouth every 6 (six) hours as needed., Disp: 15 tablet, Rfl: 0   Allergies  Allergen Reactions  . Aceon [Perindopril Erbumine]  cough  . Codeine   . Crestor [Rosuvastatin Calcium]     aching  . Diovan [Valsartan]     Dizzy,rash  . Iodine   . Norvasc [Amlodipine Besylate]   . Zetia [Ezetimibe]     cramps     Social History   Occupational History  . Not on file  Tobacco Use  . Smoking status: Former Smoker    Types: Cigarettes  . Smokeless tobacco: Never Used  Substance and Sexual Activity  . Alcohol use: No  . Drug use: No  . Sexual activity: Not on file     Family History  Problem Relation Age of Onset  . Heart disease Mother   . Diabetes Mother   . Emphysema Father   . Ovarian cancer Sister   . Breast cancer Sister   . Coronary artery disease Brother      Immunization History  Administered Date(s) Administered  . Tdap 04/28/2017      Review of systems: Positive Findings in bold print.  Constitutional:  chills, fatigue, fever, sweats, weight change Communication: Nurse, learning disability, sign Presenter, broadcasting, hand writing, iPad/Android device Head: headaches, head injury Eyes: changes in vision, eye pain, glaucoma, cataracts, macular degeneration, diplopia, glare,  light sensitivity, eyeglasses or contacts, blindness Ears nose mouth throat: Hard of hearing, ringing in ears, deaf, sign language,  vertigo,   nosebleeds,  rhinitis,  cold sores, snoring, swollen glands Cardiovascular: HTN, edema, arrhythmia, pacemaker in place, defibrillator in place,  chest pain/tightness, chronic anticoagulation, blood clot, heart failure Peripheral Vascular: leg cramps, varicose veins, blood clots, lymphedema Respiratory:  difficulty breathing, denies congestion, SOB, wheezing, cough, emphysema Gastrointestinal: change in appetite or weight, abdominal pain, constipation, diarrhea, nausea, vomiting, vomiting blood, change in bowel habits, abdominal pain, jaundice, rectal bleeding, hemorrhoids, Genitourinary:  nocturia,  pain on urination,  blood in urine, Foley catheter, urinary urgency Musculoskeletal: uses mobility aid,  cramping, stiff joints, painful joints, decreased joint motion, fractures, OA, gout Skin: +changes in toenails, color change, dryness, itching, mole changes,  rash  Neurological: headaches, numbness in feet, paresthesias in feet, burning in feet, fainting,  seizures, change in speech. denies headaches, memory problems/poor historian, cerebral palsy, weakness, paralysis Endocrine: diabetes, hypothyroidism, hyperthyroidism,  goiter, dry mouth, flushing, heat intolerance,  cold intolerance,  excessive thirst, denies polyuria,  nocturia Hematological:  easy bleeding, excessive bleeding, easy bruising, enlarged lymph nodes, on long term blood thinner, history of past transusions Allergy/immunological:  hives, eczema, frequent infections,  multiple drug allergies, seasonal allergies, transplant recipient Psychiatric:  anxiety, depression, mood disorder, suicidal ideations, hallucinations   Objective: Vascular Examination: Capillary refill time immediate x 10 digits.  Dorsalis pedis and posterior tibial pulses present b/l.  Digital hair present x 10 digits.  Skin temperature gradient WNL b/l.  Dermatological Examination: Skin with normal turgor, texture and tone b/l.  Toenails 1-5 b/l discolored, thick, dystrophic with subungual debris and pain with palpation to nailbeds due to thickness of nails.  Hyperkeratotic lesion fourth interdigital webspace left foot.  There is tenderness to palpation.  No erythema, no edema, no drainage, no flocculence noted.  Hyperkeratotic lesion right fourth digit.  There is no erythema, no edema, no drainage, no flocculence noted.  Hyperkeratotic lesion plantar aspect of left foot.  There is tenderness to palpation. No erythema, no edema, no drainage, no flocculence noted.  Musculoskeletal: Muscle strength 5/5 to all LE muscle groups  Neurological: Sensation intact with 10 gram monofilament Vibratory sensation intact.  Assessment: 1. Painful onychomycosis toenails 1-5 b/l  2. Painful  porokeratotic lesion plantar aspect left foot 3. Painful corns left fourth webspace, right fourth digit   Plan: 1. Discussed onychomycosis and treatment options.  Literature dispensed on today. 2. Toenails 1-5 b/l were debrided in length and girth without iatrogenic bleeding. 3. All hyperkeratotic lesions debrided with sterile scalpel blade plantar aspect left foot, left fourth webspace, and right fourth digit without incident. 4. Silicone toe caps were dispensed for left fourth and fifth digits, and right fourth digit.  Patient instructed on daily use to increase comfort when wearing shoe gear. 5. Patient to continue soft, supportive shoe gear daily. 6. Patient to report any pedal injuries to medical  professional immediately. 7. Follow up 3 months.  8. Patient/POA to call should there be a concern in the interim.

## 2018-10-13 ENCOUNTER — Ambulatory Visit: Payer: Medicare Other

## 2018-12-01 ENCOUNTER — Ambulatory Visit: Payer: Medicare Other

## 2018-12-12 ENCOUNTER — Ambulatory Visit
Admission: RE | Admit: 2018-12-12 | Discharge: 2018-12-12 | Disposition: A | Discharge status: Home / Self Care | Payer: Medicare Other | Source: Ambulatory Visit | Attending: Internal Medicine | Admitting: Internal Medicine

## 2018-12-12 ENCOUNTER — Other Ambulatory Visit: Payer: Self-pay

## 2018-12-12 DIAGNOSIS — Z1231 Encounter for screening mammogram for malignant neoplasm of breast: Secondary | ICD-10-CM

## 2018-12-22 ENCOUNTER — Ambulatory Visit: Payer: Medicare Other | Admitting: Podiatry

## 2019-06-24 ENCOUNTER — Other Ambulatory Visit: Payer: Self-pay | Admitting: Internal Medicine

## 2019-06-24 DIAGNOSIS — N6320 Unspecified lump in the left breast, unspecified quadrant: Secondary | ICD-10-CM

## 2019-06-24 DIAGNOSIS — N6310 Unspecified lump in the right breast, unspecified quadrant: Secondary | ICD-10-CM

## 2019-07-05 ENCOUNTER — Ambulatory Visit
Admission: RE | Admit: 2019-07-05 | Discharge: 2019-07-05 | Disposition: A | Discharge status: Home / Self Care | Payer: Medicare Other | Source: Ambulatory Visit | Attending: Internal Medicine | Admitting: Internal Medicine

## 2019-07-05 ENCOUNTER — Other Ambulatory Visit: Payer: Medicare Other

## 2019-07-05 ENCOUNTER — Other Ambulatory Visit: Payer: Self-pay | Admitting: Internal Medicine

## 2019-07-05 ENCOUNTER — Other Ambulatory Visit: Payer: Self-pay

## 2019-07-05 DIAGNOSIS — N6310 Unspecified lump in the right breast, unspecified quadrant: Secondary | ICD-10-CM

## 2019-07-05 DIAGNOSIS — N6320 Unspecified lump in the left breast, unspecified quadrant: Secondary | ICD-10-CM

## 2019-08-08 ENCOUNTER — Ambulatory Visit: Payer: Medicare Other

## 2019-08-15 ENCOUNTER — Ambulatory Visit: Payer: Medicare PPO | Attending: Internal Medicine

## 2019-08-15 DIAGNOSIS — Z23 Encounter for immunization: Secondary | ICD-10-CM

## 2019-08-15 NOTE — Progress Notes (Signed)
   Covid-19 Vaccination Clinic  Name:  Cynthia Jenkins    MRN: [id number] DOB: Feb 21, 1941  08/15/2019  [name] was observed post Covid-19 immunization for 15 minutes without incidence. She was provided with Vaccine Information Sheet and instruction to access the V-Safe system.   [name] was instructed to call 911 with any severe reactions post vaccine: Marland Kitchen Difficulty breathing  . Swelling of your face and throat  . A fast heartbeat  . A bad rash all over your body  . Dizziness and weakness    Immunizations Administered    Name Date Dose VIS Date Route   Pfizer COVID-19 Vaccine 08/15/2019 11:58 AM 0.3 mL 06/18/2019 Intramuscular   Manufacturer: [employer], [employer]   Lot: [id number]   NDC: [address]-0

## 2019-09-08 ENCOUNTER — Ambulatory Visit: Payer: Medicare PPO | Attending: Internal Medicine

## 2019-09-08 DIAGNOSIS — Z23 Encounter for immunization: Secondary | ICD-10-CM | POA: Insufficient documentation

## 2019-09-08 NOTE — Progress Notes (Signed)
   Covid-19 Vaccination Clinic  Name:  Cynthia Jenkins    MRN: [id number] DOB: 1940-11-16  09/08/2019  [name] was observed post Covid-19 immunization for 15 minutes without incident. She was provided with Vaccine Information Sheet and instruction to access the V-Safe system.   [name] was instructed to call 911 with any severe reactions post vaccine: Marland Kitchen Difficulty breathing  . Swelling of face and throat  . A fast heartbeat  . A bad rash all over body  . Dizziness and weakness   Immunizations Administered    Name Date Dose VIS Date Route   Pfizer COVID-19 Vaccine 09/08/2019 11:38 AM 0.3 mL 06/18/2019 Intramuscular   Manufacturer: [employer], [employer]   Lot: [id number]   NDC: [address]-9

## 2019-10-18 DIAGNOSIS — Z0000 Encounter for general adult medical examination without abnormal findings: Secondary | ICD-10-CM | POA: Diagnosis not present

## 2019-10-18 DIAGNOSIS — E7849 Other hyperlipidemia: Secondary | ICD-10-CM | POA: Diagnosis not present

## 2019-10-18 DIAGNOSIS — R739 Hyperglycemia, unspecified: Secondary | ICD-10-CM | POA: Diagnosis not present

## 2019-10-26 DIAGNOSIS — J309 Allergic rhinitis, unspecified: Secondary | ICD-10-CM | POA: Diagnosis not present

## 2019-10-26 DIAGNOSIS — R739 Hyperglycemia, unspecified: Secondary | ICD-10-CM | POA: Diagnosis not present

## 2019-10-26 DIAGNOSIS — Z0000 Encounter for general adult medical examination without abnormal findings: Secondary | ICD-10-CM | POA: Diagnosis not present

## 2019-10-26 DIAGNOSIS — K219 Gastro-esophageal reflux disease without esophagitis: Secondary | ICD-10-CM | POA: Diagnosis not present

## 2019-10-26 DIAGNOSIS — R82998 Other abnormal findings in urine: Secondary | ICD-10-CM | POA: Diagnosis not present

## 2019-10-26 DIAGNOSIS — F418 Other specified anxiety disorders: Secondary | ICD-10-CM | POA: Diagnosis not present

## 2019-10-26 DIAGNOSIS — Z1331 Encounter for screening for depression: Secondary | ICD-10-CM | POA: Diagnosis not present

## 2019-10-26 DIAGNOSIS — I10 Essential (primary) hypertension: Secondary | ICD-10-CM | POA: Diagnosis not present

## 2019-10-26 DIAGNOSIS — D126 Benign neoplasm of colon, unspecified: Secondary | ICD-10-CM | POA: Diagnosis not present

## 2019-10-26 DIAGNOSIS — M8580 Other specified disorders of bone density and structure, unspecified site: Secondary | ICD-10-CM | POA: Diagnosis not present

## 2019-10-26 DIAGNOSIS — E785 Hyperlipidemia, unspecified: Secondary | ICD-10-CM | POA: Diagnosis not present

## 2019-11-03 DIAGNOSIS — L814 Other melanin hyperpigmentation: Secondary | ICD-10-CM | POA: Diagnosis not present

## 2019-11-03 DIAGNOSIS — L570 Actinic keratosis: Secondary | ICD-10-CM | POA: Diagnosis not present

## 2019-11-03 DIAGNOSIS — D1801 Hemangioma of skin and subcutaneous tissue: Secondary | ICD-10-CM | POA: Diagnosis not present

## 2019-11-03 DIAGNOSIS — L820 Inflamed seborrheic keratosis: Secondary | ICD-10-CM | POA: Diagnosis not present

## 2019-11-03 DIAGNOSIS — L821 Other seborrheic keratosis: Secondary | ICD-10-CM | POA: Diagnosis not present

## 2019-11-03 DIAGNOSIS — Z85828 Personal history of other malignant neoplasm of skin: Secondary | ICD-10-CM | POA: Diagnosis not present

## 2019-12-24 DIAGNOSIS — M79644 Pain in right finger(s): Secondary | ICD-10-CM | POA: Diagnosis not present

## 2019-12-28 DIAGNOSIS — I10 Essential (primary) hypertension: Secondary | ICD-10-CM | POA: Diagnosis not present

## 2019-12-28 DIAGNOSIS — J0100 Acute maxillary sinusitis, unspecified: Secondary | ICD-10-CM | POA: Diagnosis not present

## 2019-12-28 DIAGNOSIS — J309 Allergic rhinitis, unspecified: Secondary | ICD-10-CM | POA: Diagnosis not present

## 2019-12-28 DIAGNOSIS — H6691 Otitis media, unspecified, right ear: Secondary | ICD-10-CM | POA: Diagnosis not present

## 2020-01-04 ENCOUNTER — Other Ambulatory Visit: Payer: Self-pay

## 2020-01-04 ENCOUNTER — Other Ambulatory Visit: Payer: Self-pay | Admitting: Internal Medicine

## 2020-01-04 ENCOUNTER — Ambulatory Visit
Admission: RE | Admit: 2020-01-04 | Discharge: 2020-01-04 | Disposition: A | Discharge status: Home / Self Care | Payer: Medicare PPO | Source: Ambulatory Visit | Attending: Internal Medicine | Admitting: Internal Medicine

## 2020-01-04 ENCOUNTER — Ambulatory Visit
Admission: RE | Admit: 2020-01-04 | Discharge: 2020-01-04 | Disposition: A | Discharge status: Home / Self Care | Payer: Medicare Other | Source: Ambulatory Visit | Attending: Internal Medicine | Admitting: Internal Medicine

## 2020-01-04 DIAGNOSIS — R928 Other abnormal and inconclusive findings on diagnostic imaging of breast: Secondary | ICD-10-CM | POA: Diagnosis not present

## 2020-01-04 DIAGNOSIS — N6310 Unspecified lump in the right breast, unspecified quadrant: Secondary | ICD-10-CM

## 2020-01-04 DIAGNOSIS — N6311 Unspecified lump in the right breast, upper outer quadrant: Secondary | ICD-10-CM | POA: Diagnosis not present

## 2020-01-04 DIAGNOSIS — N6312 Unspecified lump in the right breast, upper inner quadrant: Secondary | ICD-10-CM | POA: Diagnosis not present

## 2020-01-11 DIAGNOSIS — H524 Presbyopia: Secondary | ICD-10-CM | POA: Diagnosis not present

## 2020-01-17 DIAGNOSIS — R1032 Left lower quadrant pain: Secondary | ICD-10-CM | POA: Diagnosis not present

## 2020-01-17 DIAGNOSIS — I10 Essential (primary) hypertension: Secondary | ICD-10-CM | POA: Diagnosis not present

## 2020-01-17 DIAGNOSIS — K5792 Diverticulitis of intestine, part unspecified, without perforation or abscess without bleeding: Secondary | ICD-10-CM | POA: Diagnosis not present

## 2020-02-09 ENCOUNTER — Other Ambulatory Visit: Payer: Self-pay | Admitting: Internal Medicine

## 2020-02-09 DIAGNOSIS — K5732 Diverticulitis of large intestine without perforation or abscess without bleeding: Secondary | ICD-10-CM

## 2020-02-16 ENCOUNTER — Ambulatory Visit
Admission: RE | Admit: 2020-02-16 | Discharge: 2020-02-16 | Disposition: A | Discharge status: Home / Self Care | Payer: Medicare PPO | Source: Ambulatory Visit | Attending: Internal Medicine | Admitting: Internal Medicine

## 2020-02-16 DIAGNOSIS — K449 Diaphragmatic hernia without obstruction or gangrene: Secondary | ICD-10-CM | POA: Diagnosis not present

## 2020-02-16 DIAGNOSIS — K5732 Diverticulitis of large intestine without perforation or abscess without bleeding: Secondary | ICD-10-CM

## 2020-02-16 DIAGNOSIS — N281 Cyst of kidney, acquired: Secondary | ICD-10-CM | POA: Diagnosis not present

## 2020-02-16 DIAGNOSIS — I700 Atherosclerosis of aorta: Secondary | ICD-10-CM | POA: Diagnosis not present

## 2020-02-16 DIAGNOSIS — K5730 Diverticulosis of large intestine without perforation or abscess without bleeding: Secondary | ICD-10-CM | POA: Diagnosis not present

## 2020-02-17 DIAGNOSIS — L298 Other pruritus: Secondary | ICD-10-CM | POA: Diagnosis not present

## 2020-02-17 DIAGNOSIS — Z85828 Personal history of other malignant neoplasm of skin: Secondary | ICD-10-CM | POA: Diagnosis not present

## 2020-02-17 DIAGNOSIS — L720 Epidermal cyst: Secondary | ICD-10-CM | POA: Diagnosis not present

## 2020-02-17 DIAGNOSIS — L853 Xerosis cutis: Secondary | ICD-10-CM | POA: Diagnosis not present

## 2020-02-17 DIAGNOSIS — L723 Sebaceous cyst: Secondary | ICD-10-CM | POA: Diagnosis not present

## 2020-02-17 DIAGNOSIS — L820 Inflamed seborrheic keratosis: Secondary | ICD-10-CM | POA: Diagnosis not present

## 2020-02-19 ENCOUNTER — Other Ambulatory Visit: Payer: Medicare PPO

## 2020-04-25 DIAGNOSIS — M1990 Unspecified osteoarthritis, unspecified site: Secondary | ICD-10-CM | POA: Diagnosis not present

## 2020-04-25 DIAGNOSIS — M8580 Other specified disorders of bone density and structure, unspecified site: Secondary | ICD-10-CM | POA: Diagnosis not present

## 2020-04-25 DIAGNOSIS — Z23 Encounter for immunization: Secondary | ICD-10-CM | POA: Diagnosis not present

## 2020-04-25 DIAGNOSIS — F418 Other specified anxiety disorders: Secondary | ICD-10-CM | POA: Diagnosis not present

## 2020-04-25 DIAGNOSIS — J309 Allergic rhinitis, unspecified: Secondary | ICD-10-CM | POA: Diagnosis not present

## 2020-04-25 DIAGNOSIS — K5732 Diverticulitis of large intestine without perforation or abscess without bleeding: Secondary | ICD-10-CM | POA: Diagnosis not present

## 2020-04-25 DIAGNOSIS — E785 Hyperlipidemia, unspecified: Secondary | ICD-10-CM | POA: Diagnosis not present

## 2020-04-25 DIAGNOSIS — I10 Essential (primary) hypertension: Secondary | ICD-10-CM | POA: Diagnosis not present

## 2020-04-25 DIAGNOSIS — K219 Gastro-esophageal reflux disease without esophagitis: Secondary | ICD-10-CM | POA: Diagnosis not present

## 2020-05-19 DIAGNOSIS — H9193 Unspecified hearing loss, bilateral: Secondary | ICD-10-CM | POA: Diagnosis not present

## 2020-05-19 DIAGNOSIS — I10 Essential (primary) hypertension: Secondary | ICD-10-CM | POA: Diagnosis not present

## 2020-05-25 DIAGNOSIS — L309 Dermatitis, unspecified: Secondary | ICD-10-CM | POA: Diagnosis not present

## 2020-05-25 DIAGNOSIS — L720 Epidermal cyst: Secondary | ICD-10-CM | POA: Diagnosis not present

## 2020-05-25 DIAGNOSIS — L820 Inflamed seborrheic keratosis: Secondary | ICD-10-CM | POA: Diagnosis not present

## 2020-05-25 DIAGNOSIS — L570 Actinic keratosis: Secondary | ICD-10-CM | POA: Diagnosis not present

## 2020-05-25 DIAGNOSIS — Z85828 Personal history of other malignant neoplasm of skin: Secondary | ICD-10-CM | POA: Diagnosis not present

## 2020-06-22 ENCOUNTER — Ambulatory Visit: Payer: Medicare PPO | Attending: Internal Medicine

## 2020-06-22 DIAGNOSIS — Z23 Encounter for immunization: Secondary | ICD-10-CM

## 2020-06-22 NOTE — Progress Notes (Signed)
° °  Covid-19 Vaccination Clinic  Name:  Cynthia Jenkins    MRN: [id number] DOB: [birth year]  06/22/2020  [name] was observed post Covid-19 immunization for 15 minutes without incident. She was provided with Vaccine Information Sheet and instruction to access the V-Safe system.   [name] was instructed to call 911 with any severe reactions post vaccine:  Difficulty breathing   Swelling of face and throat   A fast heartbeat   A bad rash all over body   Dizziness and weakness   Immunizations Administered    Name Date Dose VIS Date Route   Pfizer COVID-19 Vaccine 06/22/2020  4:08 PM 0.3 mL 04/26/2020 Intramuscular   Manufacturer: [employer], [employer]   Lot: [id number]   NDC: [address]-9

## 2020-06-26 DIAGNOSIS — H9313 Tinnitus, bilateral: Secondary | ICD-10-CM | POA: Diagnosis not present

## 2020-06-26 DIAGNOSIS — H903 Sensorineural hearing loss, bilateral: Secondary | ICD-10-CM | POA: Diagnosis not present

## 2020-07-11 ENCOUNTER — Ambulatory Visit
Admission: RE | Admit: 2020-07-11 | Discharge: 2020-07-11 | Disposition: A | Discharge status: Home / Self Care | Payer: Medicare PPO | Source: Ambulatory Visit | Attending: Internal Medicine | Admitting: Internal Medicine

## 2020-07-11 ENCOUNTER — Other Ambulatory Visit: Payer: Self-pay

## 2020-07-11 DIAGNOSIS — R922 Inconclusive mammogram: Secondary | ICD-10-CM | POA: Diagnosis not present

## 2020-07-11 DIAGNOSIS — N6310 Unspecified lump in the right breast, unspecified quadrant: Secondary | ICD-10-CM

## 2020-07-11 DIAGNOSIS — N6315 Unspecified lump in the right breast, overlapping quadrants: Secondary | ICD-10-CM | POA: Diagnosis not present

## 2020-09-11 DIAGNOSIS — M25552 Pain in left hip: Secondary | ICD-10-CM | POA: Diagnosis not present

## 2020-09-11 DIAGNOSIS — M5459 Other low back pain: Secondary | ICD-10-CM | POA: Diagnosis not present

## 2020-10-05 DIAGNOSIS — K219 Gastro-esophageal reflux disease without esophagitis: Secondary | ICD-10-CM | POA: Diagnosis not present

## 2020-10-05 DIAGNOSIS — R109 Unspecified abdominal pain: Secondary | ICD-10-CM | POA: Diagnosis not present

## 2020-10-05 DIAGNOSIS — R110 Nausea: Secondary | ICD-10-CM | POA: Diagnosis not present

## 2020-10-05 DIAGNOSIS — R42 Dizziness and giddiness: Secondary | ICD-10-CM | POA: Diagnosis not present

## 2020-10-05 DIAGNOSIS — R195 Other fecal abnormalities: Secondary | ICD-10-CM | POA: Diagnosis not present

## 2020-11-07 DIAGNOSIS — M79672 Pain in left foot: Secondary | ICD-10-CM | POA: Diagnosis not present

## 2020-11-09 DIAGNOSIS — L821 Other seborrheic keratosis: Secondary | ICD-10-CM | POA: Diagnosis not present

## 2020-11-09 DIAGNOSIS — Z85828 Personal history of other malignant neoplasm of skin: Secondary | ICD-10-CM | POA: Diagnosis not present

## 2020-11-09 DIAGNOSIS — D1801 Hemangioma of skin and subcutaneous tissue: Secondary | ICD-10-CM | POA: Diagnosis not present

## 2020-11-09 DIAGNOSIS — L738 Other specified follicular disorders: Secondary | ICD-10-CM | POA: Diagnosis not present

## 2020-11-09 DIAGNOSIS — D225 Melanocytic nevi of trunk: Secondary | ICD-10-CM | POA: Diagnosis not present

## 2020-11-09 DIAGNOSIS — L853 Xerosis cutis: Secondary | ICD-10-CM | POA: Diagnosis not present

## 2020-11-09 DIAGNOSIS — L570 Actinic keratosis: Secondary | ICD-10-CM | POA: Diagnosis not present

## 2020-11-09 DIAGNOSIS — D0471 Carcinoma in situ of skin of right lower limb, including hip: Secondary | ICD-10-CM | POA: Diagnosis not present

## 2020-11-09 DIAGNOSIS — C44722 Squamous cell carcinoma of skin of right lower limb, including hip: Secondary | ICD-10-CM | POA: Diagnosis not present

## 2020-11-09 DIAGNOSIS — L720 Epidermal cyst: Secondary | ICD-10-CM | POA: Diagnosis not present

## 2020-11-28 DIAGNOSIS — M859 Disorder of bone density and structure, unspecified: Secondary | ICD-10-CM | POA: Diagnosis not present

## 2020-11-28 DIAGNOSIS — E785 Hyperlipidemia, unspecified: Secondary | ICD-10-CM | POA: Diagnosis not present

## 2020-11-28 DIAGNOSIS — I10 Essential (primary) hypertension: Secondary | ICD-10-CM | POA: Diagnosis not present

## 2020-11-28 DIAGNOSIS — R82998 Other abnormal findings in urine: Secondary | ICD-10-CM | POA: Diagnosis not present

## 2020-12-05 DIAGNOSIS — R0600 Dyspnea, unspecified: Secondary | ICD-10-CM | POA: Diagnosis not present

## 2020-12-05 DIAGNOSIS — M1990 Unspecified osteoarthritis, unspecified site: Secondary | ICD-10-CM | POA: Diagnosis not present

## 2020-12-05 DIAGNOSIS — I10 Essential (primary) hypertension: Secondary | ICD-10-CM | POA: Diagnosis not present

## 2020-12-05 DIAGNOSIS — K219 Gastro-esophageal reflux disease without esophagitis: Secondary | ICD-10-CM | POA: Diagnosis not present

## 2020-12-05 DIAGNOSIS — R109 Unspecified abdominal pain: Secondary | ICD-10-CM | POA: Diagnosis not present

## 2020-12-05 DIAGNOSIS — Z0000 Encounter for general adult medical examination without abnormal findings: Secondary | ICD-10-CM | POA: Diagnosis not present

## 2020-12-05 DIAGNOSIS — I498 Other specified cardiac arrhythmias: Secondary | ICD-10-CM | POA: Diagnosis not present

## 2020-12-05 DIAGNOSIS — E785 Hyperlipidemia, unspecified: Secondary | ICD-10-CM | POA: Diagnosis not present

## 2020-12-05 DIAGNOSIS — M8580 Other specified disorders of bone density and structure, unspecified site: Secondary | ICD-10-CM | POA: Diagnosis not present

## 2020-12-08 ENCOUNTER — Other Ambulatory Visit: Payer: Self-pay | Admitting: Internal Medicine

## 2020-12-08 DIAGNOSIS — M8580 Other specified disorders of bone density and structure, unspecified site: Secondary | ICD-10-CM

## 2021-01-11 DIAGNOSIS — H2513 Age-related nuclear cataract, bilateral: Secondary | ICD-10-CM | POA: Diagnosis not present

## 2021-01-23 DIAGNOSIS — Z85828 Personal history of other malignant neoplasm of skin: Secondary | ICD-10-CM | POA: Diagnosis not present

## 2021-01-23 DIAGNOSIS — D1801 Hemangioma of skin and subcutaneous tissue: Secondary | ICD-10-CM | POA: Diagnosis not present

## 2021-01-23 DIAGNOSIS — L821 Other seborrheic keratosis: Secondary | ICD-10-CM | POA: Diagnosis not present

## 2021-01-23 DIAGNOSIS — L570 Actinic keratosis: Secondary | ICD-10-CM | POA: Diagnosis not present

## 2021-01-23 DIAGNOSIS — D692 Other nonthrombocytopenic purpura: Secondary | ICD-10-CM | POA: Diagnosis not present

## 2021-01-23 DIAGNOSIS — D225 Melanocytic nevi of trunk: Secondary | ICD-10-CM | POA: Diagnosis not present

## 2021-01-23 DIAGNOSIS — L814 Other melanin hyperpigmentation: Secondary | ICD-10-CM | POA: Diagnosis not present

## 2021-01-23 DIAGNOSIS — L820 Inflamed seborrheic keratosis: Secondary | ICD-10-CM | POA: Diagnosis not present

## 2021-01-23 DIAGNOSIS — C44619 Basal cell carcinoma of skin of left upper limb, including shoulder: Secondary | ICD-10-CM | POA: Diagnosis not present

## 2021-02-06 ENCOUNTER — Other Ambulatory Visit: Payer: Self-pay | Admitting: Internal Medicine

## 2021-02-06 DIAGNOSIS — N6310 Unspecified lump in the right breast, unspecified quadrant: Secondary | ICD-10-CM

## 2021-02-19 DIAGNOSIS — M13872 Other specified arthritis, left ankle and foot: Secondary | ICD-10-CM | POA: Diagnosis not present

## 2021-02-22 DIAGNOSIS — C44619 Basal cell carcinoma of skin of left upper limb, including shoulder: Secondary | ICD-10-CM | POA: Diagnosis not present

## 2021-02-22 DIAGNOSIS — Z85828 Personal history of other malignant neoplasm of skin: Secondary | ICD-10-CM | POA: Diagnosis not present

## 2021-04-02 DIAGNOSIS — M13872 Other specified arthritis, left ankle and foot: Secondary | ICD-10-CM | POA: Diagnosis not present

## 2021-05-05 DIAGNOSIS — Z23 Encounter for immunization: Secondary | ICD-10-CM | POA: Diagnosis not present

## 2021-06-05 DIAGNOSIS — M1990 Unspecified osteoarthritis, unspecified site: Secondary | ICD-10-CM | POA: Diagnosis not present

## 2021-06-05 DIAGNOSIS — M8580 Other specified disorders of bone density and structure, unspecified site: Secondary | ICD-10-CM | POA: Diagnosis not present

## 2021-06-05 DIAGNOSIS — K219 Gastro-esophageal reflux disease without esophagitis: Secondary | ICD-10-CM | POA: Diagnosis not present

## 2021-06-05 DIAGNOSIS — D126 Benign neoplasm of colon, unspecified: Secondary | ICD-10-CM | POA: Diagnosis not present

## 2021-06-05 DIAGNOSIS — R109 Unspecified abdominal pain: Secondary | ICD-10-CM | POA: Diagnosis not present

## 2021-06-05 DIAGNOSIS — E785 Hyperlipidemia, unspecified: Secondary | ICD-10-CM | POA: Diagnosis not present

## 2021-06-05 DIAGNOSIS — R490 Dysphonia: Secondary | ICD-10-CM | POA: Diagnosis not present

## 2021-06-05 DIAGNOSIS — N63 Unspecified lump in unspecified breast: Secondary | ICD-10-CM | POA: Diagnosis not present

## 2021-06-05 DIAGNOSIS — I10 Essential (primary) hypertension: Secondary | ICD-10-CM | POA: Diagnosis not present

## 2021-06-14 ENCOUNTER — Other Ambulatory Visit: Payer: Medicare PPO

## 2021-07-10 DIAGNOSIS — M8589 Other specified disorders of bone density and structure, multiple sites: Secondary | ICD-10-CM | POA: Diagnosis not present

## 2021-07-17 ENCOUNTER — Ambulatory Visit
Admission: RE | Admit: 2021-07-17 | Discharge: 2021-07-17 | Disposition: A | Discharge status: Home / Self Care | Payer: Medicare Other | Source: Ambulatory Visit | Attending: Internal Medicine | Admitting: Internal Medicine

## 2021-07-17 DIAGNOSIS — N6310 Unspecified lump in the right breast, unspecified quadrant: Secondary | ICD-10-CM

## 2021-07-17 DIAGNOSIS — N6312 Unspecified lump in the right breast, upper inner quadrant: Secondary | ICD-10-CM | POA: Diagnosis not present

## 2021-07-17 DIAGNOSIS — R928 Other abnormal and inconclusive findings on diagnostic imaging of breast: Secondary | ICD-10-CM | POA: Diagnosis not present

## 2021-07-17 DIAGNOSIS — N6311 Unspecified lump in the right breast, upper outer quadrant: Secondary | ICD-10-CM | POA: Diagnosis not present

## 2021-08-31 DIAGNOSIS — J069 Acute upper respiratory infection, unspecified: Secondary | ICD-10-CM | POA: Diagnosis not present

## 2021-08-31 DIAGNOSIS — I10 Essential (primary) hypertension: Secondary | ICD-10-CM | POA: Diagnosis not present

## 2021-08-31 DIAGNOSIS — R051 Acute cough: Secondary | ICD-10-CM | POA: Diagnosis not present

## 2021-09-03 DIAGNOSIS — J069 Acute upper respiratory infection, unspecified: Secondary | ICD-10-CM | POA: Diagnosis not present

## 2021-09-03 DIAGNOSIS — R051 Acute cough: Secondary | ICD-10-CM | POA: Diagnosis not present

## 2021-09-03 DIAGNOSIS — U071 COVID-19: Secondary | ICD-10-CM | POA: Diagnosis not present

## 2021-09-03 DIAGNOSIS — I10 Essential (primary) hypertension: Secondary | ICD-10-CM | POA: Diagnosis not present

## 2021-09-24 DIAGNOSIS — H6983 Other specified disorders of Eustachian tube, bilateral: Secondary | ICD-10-CM | POA: Diagnosis not present

## 2021-09-24 DIAGNOSIS — J342 Deviated nasal septum: Secondary | ICD-10-CM | POA: Diagnosis not present

## 2021-09-24 DIAGNOSIS — J310 Chronic rhinitis: Secondary | ICD-10-CM | POA: Diagnosis not present

## 2021-09-24 DIAGNOSIS — H903 Sensorineural hearing loss, bilateral: Secondary | ICD-10-CM | POA: Diagnosis not present

## 2021-10-03 DIAGNOSIS — J309 Allergic rhinitis, unspecified: Secondary | ICD-10-CM | POA: Diagnosis not present

## 2021-10-03 DIAGNOSIS — R42 Dizziness and giddiness: Secondary | ICD-10-CM | POA: Diagnosis not present

## 2021-10-03 DIAGNOSIS — I10 Essential (primary) hypertension: Secondary | ICD-10-CM | POA: Diagnosis not present

## 2021-10-03 DIAGNOSIS — F418 Other specified anxiety disorders: Secondary | ICD-10-CM | POA: Diagnosis not present

## 2021-10-17 ENCOUNTER — Ambulatory Visit (HOSPITAL_COMMUNITY)
Admission: RE | Admit: 2021-10-17 | Discharge: 2021-10-17 | Disposition: A | Discharge status: Home / Self Care | Payer: Medicare Other | Source: Ambulatory Visit | Attending: Internal Medicine | Admitting: Internal Medicine

## 2021-10-17 ENCOUNTER — Other Ambulatory Visit (HOSPITAL_COMMUNITY): Payer: Self-pay | Admitting: Internal Medicine

## 2021-10-17 DIAGNOSIS — R42 Dizziness and giddiness: Secondary | ICD-10-CM

## 2021-10-29 DIAGNOSIS — R8290 Unspecified abnormal findings in urine: Secondary | ICD-10-CM | POA: Diagnosis not present

## 2021-10-29 DIAGNOSIS — R42 Dizziness and giddiness: Secondary | ICD-10-CM | POA: Diagnosis not present

## 2021-10-29 DIAGNOSIS — I10 Essential (primary) hypertension: Secondary | ICD-10-CM | POA: Diagnosis not present

## 2021-10-29 DIAGNOSIS — E785 Hyperlipidemia, unspecified: Secondary | ICD-10-CM | POA: Diagnosis not present

## 2021-11-30 DIAGNOSIS — J310 Chronic rhinitis: Secondary | ICD-10-CM | POA: Diagnosis not present

## 2021-11-30 DIAGNOSIS — J342 Deviated nasal septum: Secondary | ICD-10-CM | POA: Diagnosis not present

## 2021-11-30 DIAGNOSIS — J343 Hypertrophy of nasal turbinates: Secondary | ICD-10-CM | POA: Diagnosis not present

## 2022-01-07 DIAGNOSIS — I10 Essential (primary) hypertension: Secondary | ICD-10-CM | POA: Diagnosis not present

## 2022-01-07 DIAGNOSIS — E785 Hyperlipidemia, unspecified: Secondary | ICD-10-CM | POA: Diagnosis not present

## 2022-01-07 DIAGNOSIS — M8580 Other specified disorders of bone density and structure, unspecified site: Secondary | ICD-10-CM | POA: Diagnosis not present

## 2022-01-07 DIAGNOSIS — R82998 Other abnormal findings in urine: Secondary | ICD-10-CM | POA: Diagnosis not present

## 2022-01-15 ENCOUNTER — Other Ambulatory Visit: Payer: Self-pay | Admitting: Internal Medicine

## 2022-01-15 DIAGNOSIS — R109 Unspecified abdominal pain: Secondary | ICD-10-CM

## 2022-01-15 DIAGNOSIS — I10 Essential (primary) hypertension: Secondary | ICD-10-CM | POA: Diagnosis not present

## 2022-01-15 DIAGNOSIS — Z0000 Encounter for general adult medical examination without abnormal findings: Secondary | ICD-10-CM | POA: Diagnosis not present

## 2022-01-15 DIAGNOSIS — E785 Hyperlipidemia, unspecified: Secondary | ICD-10-CM | POA: Diagnosis not present

## 2022-01-17 DIAGNOSIS — H5203 Hypermetropia, bilateral: Secondary | ICD-10-CM | POA: Diagnosis not present

## 2022-01-29 DIAGNOSIS — L814 Other melanin hyperpigmentation: Secondary | ICD-10-CM | POA: Diagnosis not present

## 2022-01-29 DIAGNOSIS — L821 Other seborrheic keratosis: Secondary | ICD-10-CM | POA: Diagnosis not present

## 2022-01-29 DIAGNOSIS — D1801 Hemangioma of skin and subcutaneous tissue: Secondary | ICD-10-CM | POA: Diagnosis not present

## 2022-01-29 DIAGNOSIS — D0472 Carcinoma in situ of skin of left lower limb, including hip: Secondary | ICD-10-CM | POA: Diagnosis not present

## 2022-01-29 DIAGNOSIS — Z85828 Personal history of other malignant neoplasm of skin: Secondary | ICD-10-CM | POA: Diagnosis not present

## 2022-02-01 DIAGNOSIS — J342 Deviated nasal septum: Secondary | ICD-10-CM | POA: Diagnosis not present

## 2022-02-01 DIAGNOSIS — J310 Chronic rhinitis: Secondary | ICD-10-CM | POA: Diagnosis not present

## 2022-02-01 DIAGNOSIS — J343 Hypertrophy of nasal turbinates: Secondary | ICD-10-CM | POA: Diagnosis not present

## 2022-02-12 ENCOUNTER — Ambulatory Visit
Admission: RE | Admit: 2022-02-12 | Discharge: 2022-02-12 | Disposition: A | Discharge status: Home / Self Care | Payer: Medicare Other | Source: Ambulatory Visit | Attending: Internal Medicine | Admitting: Internal Medicine

## 2022-02-12 DIAGNOSIS — R109 Unspecified abdominal pain: Secondary | ICD-10-CM | POA: Diagnosis not present

## 2022-02-12 DIAGNOSIS — K449 Diaphragmatic hernia without obstruction or gangrene: Secondary | ICD-10-CM | POA: Diagnosis not present

## 2022-03-21 DIAGNOSIS — M25572 Pain in left ankle and joints of left foot: Secondary | ICD-10-CM | POA: Diagnosis not present

## 2022-04-09 DIAGNOSIS — M1712 Unilateral primary osteoarthritis, left knee: Secondary | ICD-10-CM | POA: Diagnosis not present

## 2022-04-09 DIAGNOSIS — M170 Bilateral primary osteoarthritis of knee: Secondary | ICD-10-CM | POA: Diagnosis not present

## 2022-04-09 DIAGNOSIS — M7061 Trochanteric bursitis, right hip: Secondary | ICD-10-CM | POA: Diagnosis not present

## 2022-04-09 DIAGNOSIS — M25551 Pain in right hip: Secondary | ICD-10-CM | POA: Diagnosis not present

## 2022-04-15 DIAGNOSIS — M2042 Other hammer toe(s) (acquired), left foot: Secondary | ICD-10-CM | POA: Diagnosis not present

## 2022-04-15 DIAGNOSIS — R252 Cramp and spasm: Secondary | ICD-10-CM | POA: Diagnosis not present

## 2022-04-15 DIAGNOSIS — M13872 Other specified arthritis, left ankle and foot: Secondary | ICD-10-CM | POA: Diagnosis not present

## 2022-04-29 DIAGNOSIS — M25551 Pain in right hip: Secondary | ICD-10-CM | POA: Diagnosis not present

## 2022-05-04 DIAGNOSIS — Z23 Encounter for immunization: Secondary | ICD-10-CM | POA: Diagnosis not present

## 2022-05-20 DIAGNOSIS — M25532 Pain in left wrist: Secondary | ICD-10-CM | POA: Diagnosis not present

## 2022-05-23 DIAGNOSIS — M25832 Other specified joint disorders, left wrist: Secondary | ICD-10-CM | POA: Diagnosis not present

## 2022-06-17 ENCOUNTER — Other Ambulatory Visit: Payer: Self-pay | Admitting: Internal Medicine

## 2022-06-17 DIAGNOSIS — Z1231 Encounter for screening mammogram for malignant neoplasm of breast: Secondary | ICD-10-CM

## 2022-07-03 ENCOUNTER — Other Ambulatory Visit: Payer: Self-pay

## 2022-07-03 ENCOUNTER — Observation Stay (HOSPITAL_COMMUNITY)
Admission: EM | Admit: 2022-07-03 | Discharge: 2022-07-06 | Disposition: A | Discharge status: Home Health | Payer: Medicare Other | Attending: Internal Medicine | Admitting: Internal Medicine

## 2022-07-03 ENCOUNTER — Emergency Department (HOSPITAL_COMMUNITY): Payer: Medicare Other

## 2022-07-03 ENCOUNTER — Encounter (HOSPITAL_COMMUNITY): Payer: Self-pay

## 2022-07-03 DIAGNOSIS — I10 Essential (primary) hypertension: Secondary | ICD-10-CM | POA: Diagnosis not present

## 2022-07-03 DIAGNOSIS — R2681 Unsteadiness on feet: Secondary | ICD-10-CM | POA: Diagnosis not present

## 2022-07-03 DIAGNOSIS — Z87891 Personal history of nicotine dependence: Secondary | ICD-10-CM | POA: Insufficient documentation

## 2022-07-03 DIAGNOSIS — Z79899 Other long term (current) drug therapy: Secondary | ICD-10-CM | POA: Insufficient documentation

## 2022-07-03 DIAGNOSIS — R1111 Vomiting without nausea: Secondary | ICD-10-CM | POA: Diagnosis not present

## 2022-07-03 DIAGNOSIS — R110 Nausea: Secondary | ICD-10-CM | POA: Diagnosis not present

## 2022-07-03 DIAGNOSIS — R42 Dizziness and giddiness: Secondary | ICD-10-CM | POA: Diagnosis not present

## 2022-07-03 DIAGNOSIS — E7800 Pure hypercholesterolemia, unspecified: Secondary | ICD-10-CM | POA: Diagnosis present

## 2022-07-03 DIAGNOSIS — R531 Weakness: Secondary | ICD-10-CM

## 2022-07-03 DIAGNOSIS — Z1152 Encounter for screening for COVID-19: Secondary | ICD-10-CM | POA: Insufficient documentation

## 2022-07-03 DIAGNOSIS — R262 Difficulty in walking, not elsewhere classified: Secondary | ICD-10-CM | POA: Insufficient documentation

## 2022-07-03 DIAGNOSIS — J101 Influenza due to other identified influenza virus with other respiratory manifestations: Secondary | ICD-10-CM | POA: Diagnosis not present

## 2022-07-03 DIAGNOSIS — J9601 Acute respiratory failure with hypoxia: Secondary | ICD-10-CM

## 2022-07-03 DIAGNOSIS — M6281 Muscle weakness (generalized): Secondary | ICD-10-CM | POA: Insufficient documentation

## 2022-07-03 DIAGNOSIS — R062 Wheezing: Secondary | ICD-10-CM | POA: Diagnosis not present

## 2022-07-03 DIAGNOSIS — Z7982 Long term (current) use of aspirin: Secondary | ICD-10-CM | POA: Diagnosis not present

## 2022-07-03 DIAGNOSIS — R059 Cough, unspecified: Secondary | ICD-10-CM | POA: Diagnosis not present

## 2022-07-03 LAB — COMPREHENSIVE METABOLIC PANEL
ALT: 23 U/L (ref 0–44)
AST: 24 U/L (ref 15–41)
Albumin: 3.3 g/dL — ABNORMAL LOW (ref 3.5–5.0)
Alkaline Phosphatase: 40 U/L (ref 38–126)
Anion gap: 8 (ref 5–15)
BUN: 14 mg/dL (ref 8–23)
CO2: 25 mmol/L (ref 22–32)
Calcium: 8.7 mg/dL — ABNORMAL LOW (ref 8.9–10.3)
Chloride: 106 mmol/L (ref 98–111)
Creatinine, Ser: 0.94 mg/dL (ref 0.44–1.00)
GFR, Estimated: >60 mL/min (ref >60)
Glucose, Bld: 114 mg/dL — ABNORMAL HIGH (ref 70–99)
Potassium: 3.6 mmol/L (ref 3.5–5.1)
Sodium: 139 mmol/L (ref 135–145)
Total Bilirubin: 0.8 mg/dL (ref 0.3–1.2)
Total Protein: 6.5 g/dL (ref 6.5–8.1)

## 2022-07-03 LAB — RESP PANEL BY RT-PCR (RSV, FLU A&B, COVID)  RVPGX2
Influenza A by PCR: POSITIVE — AB
Influenza B by PCR: NEGATIVE
Resp Syncytial Virus by PCR: NEGATIVE
SARS Coronavirus 2 by RT PCR: NEGATIVE

## 2022-07-03 LAB — CBC WITH DIFFERENTIAL/PLATELET
Abs Immature Granulocytes: 0.09 10*3/uL — ABNORMAL HIGH (ref 0.00–0.07)
Basophils Absolute: 0 10*3/uL (ref 0.0–0.1)
Basophils Relative: 0 %
Eosinophils Absolute: 0.1 10*3/uL (ref 0.0–0.5)
Eosinophils Relative: 1 %
HCT: 46.3 % — ABNORMAL HIGH (ref 36.0–46.0)
Hemoglobin: 15.2 g/dL — ABNORMAL HIGH (ref 12.0–15.0)
Immature Granulocytes: 1 %
Lymphocytes Relative: 14 %
Lymphs Abs: 1.2 10*3/uL (ref 0.7–4.0)
MCH: 31.2 pg (ref 26.0–34.0)
MCHC: 32.8 g/dL (ref 30.0–36.0)
MCV: 95.1 fL (ref 80.0–100.0)
Monocytes Absolute: 0.8 10*3/uL (ref 0.1–1.0)
Monocytes Relative: 10 %
Neutro Abs: 6.2 10*3/uL (ref 1.7–7.7)
Neutrophils Relative %: 74 %
Platelets: 192 10*3/uL (ref 150–400)
RBC: 4.87 MIL/uL (ref 3.87–5.11)
RDW: 13.4 % (ref 11.5–15.5)
WBC: 8.4 10*3/uL (ref 4.0–10.5)
nRBC: 0 % (ref 0.0–0.2)

## 2022-07-03 LAB — I-STAT CHEM 8, ED
BUN: 12 mg/dL (ref 8–23)
Calcium, Ion: 1.1 mmol/L — ABNORMAL LOW (ref 1.15–1.40)
Chloride: 103 mmol/L (ref 98–111)
Creatinine, Ser: 0.8 mg/dL (ref 0.44–1.00)
Glucose, Bld: 110 mg/dL — ABNORMAL HIGH (ref 70–99)
HCT: 44 % (ref 36.0–46.0)
Hemoglobin: 15 g/dL (ref 12.0–15.0)
Potassium: 3.5 mmol/L (ref 3.5–5.1)
Sodium: 139 mmol/L (ref 135–145)
TCO2: 24 mmol/L (ref 22–32)

## 2022-07-03 LAB — URINALYSIS, ROUTINE W REFLEX MICROSCOPIC
Bilirubin Urine: NEGATIVE
Glucose, UA: NEGATIVE mg/dL
Ketones, ur: 5 mg/dL — AB
Nitrite: NEGATIVE
Protein, ur: NEGATIVE mg/dL
Specific Gravity, Urine: 1.014 (ref 1.005–1.030)
pH: 6 (ref 5.0–8.0)

## 2022-07-03 LAB — BLOOD GAS, VENOUS
Acid-Base Excess: 3.5 mmol/L — ABNORMAL HIGH (ref 0.0–2.0)
Bicarbonate: 29.1 mmol/L — ABNORMAL HIGH (ref 20.0–28.0)
O2 Saturation: 52.8 %
Patient temperature: 37
pCO2, Ven: 47 mmHg (ref 44–60)
pH, Ven: 7.4 (ref 7.25–7.43)
pO2, Ven: <31 mmHg — CL (ref 32–45)

## 2022-07-03 LAB — LIPASE, BLOOD: Lipase: 29 U/L (ref 11–51)

## 2022-07-03 LAB — CBG MONITORING, ED: Glucose-Capillary: 109 mg/dL — ABNORMAL HIGH (ref 70–99)

## 2022-07-03 MED ORDER — OSELTAMIVIR PHOSPHATE 75 MG PO CAPS: 75.0000 mg | ORAL_CAPSULE | Freq: Two times a day (BID) | ORAL | Status: DC

## 2022-07-03 MED ORDER — SENNOSIDES-DOCUSATE SODIUM 8.6-50 MG PO TABS: 1.0000 | ORAL_TABLET | Freq: Every evening | ORAL | Status: DC | PRN

## 2022-07-03 MED ORDER — ALBUTEROL SULFATE (2.5 MG/3ML) 0.083% IN NEBU
5.0000 mg | INHALATION_SOLUTION | Freq: Once | RESPIRATORY_TRACT | Status: AC
  Administered 2022-07-03: 5 mg via RESPIRATORY_TRACT
  Filled 2022-07-03: qty 6

## 2022-07-03 MED ORDER — ENOXAPARIN SODIUM 40 MG/0.4ML IJ SOSY
40.0000 mg | PREFILLED_SYRINGE | Freq: Every day | INTRAMUSCULAR | Status: DC
  Administered 2022-07-03 – 2022-07-05 (×3): 40 mg via SUBCUTANEOUS
  Filled 2022-07-03 (×3): qty 0.4

## 2022-07-03 MED ORDER — ACETAMINOPHEN 650 MG RE SUPP: 650.0000 mg | Freq: Four times a day (QID) | RECTAL | Status: DC | PRN

## 2022-07-03 MED ORDER — ONDANSETRON HCL 4 MG PO TABS: 4.0000 mg | ORAL_TABLET | Freq: Four times a day (QID) | ORAL | Status: DC | PRN

## 2022-07-03 MED ORDER — OSELTAMIVIR PHOSPHATE 75 MG PO CAPS
75.0000 mg | ORAL_CAPSULE | Freq: Once | ORAL | Status: AC
Start: 2022-07-03 — End: 2022-07-03
  Administered 2022-07-03: 75 mg via ORAL
  Filled 2022-07-03: qty 1

## 2022-07-03 MED ORDER — ALBUTEROL SULFATE (2.5 MG/3ML) 0.083% IN NEBU: 2.5000 mg | INHALATION_SOLUTION | RESPIRATORY_TRACT | Status: DC | PRN

## 2022-07-03 MED ORDER — LACTATED RINGERS IV BOLUS
1000.0000 mL | Freq: Once | INTRAVENOUS | Status: AC
  Administered 2022-07-03: 1000 mL via INTRAVENOUS

## 2022-07-03 MED ORDER — OSELTAMIVIR PHOSPHATE 30 MG PO CAPS
30.0000 mg | ORAL_CAPSULE | Freq: Two times a day (BID) | ORAL | Status: DC
  Administered 2022-07-04 – 2022-07-06 (×5): 30 mg via ORAL
  Filled 2022-07-03 (×5): qty 1

## 2022-07-03 MED ORDER — SODIUM CHLORIDE 0.9 % IV SOLN: INTRAVENOUS | Status: AC

## 2022-07-03 MED ORDER — ONDANSETRON HCL 4 MG/2ML IJ SOLN: 4.0000 mg | Freq: Four times a day (QID) | INTRAMUSCULAR | Status: DC | PRN

## 2022-07-03 MED ORDER — ACETAMINOPHEN 325 MG PO TABS
650.0000 mg | ORAL_TABLET | Freq: Four times a day (QID) | ORAL | Status: DC | PRN
  Administered 2022-07-03: 650 mg via ORAL
  Filled 2022-07-03: qty 2

## 2022-07-03 NOTE — ED Triage Notes (Signed)
Pt bib ems from home c/o increased weakness, nausea and vomiting x few days. Also reports increased urination 4mg  zofran 20g left forearm

## 2022-07-03 NOTE — ED Provider Notes (Signed)
Patient presented with cough and generalized weakness.  Was in the waiting room when she had an episode of unresponsiveness that lasted several minutes.  I evaluated her and she did not have any focal weakness.  Was lethargic appearing.  After several minutes without any additional interventions came back to baseline and was alert and oriented x 3.  Neuroexam did not show any weakness, facial droop, or cranial nerve abnormality.  Will take patient for head CT at this time due to concerns for possible seizure.  Per patient's husband she does not have any history of seizures and is not on blood thinners and has not had severe headache recently.   Rondel Baton, MD 07/03/22 [phone]

## 2022-07-03 NOTE — ED Notes (Signed)
ED TO INPATIENT HANDOFF REPORT  ED Nurse Name and Phone #: Mady Gemma Name/Age/Gender Revonda Humphrey 81 y.o. female Room/Bed: WA20/WA20  Code Status   Code Status: Not on file  Home/SNF/Other Home Patient oriented to: self, place, time, and situation Is this baseline? Yes   Triage Complete: Triage complete  Chief Complaint Influenza A [J10.1]  Triage Note Pt bib ems from home c/o increased weakness, nausea and vomiting x few days. Also reports increased urination 4mg  zofran 20g left forearm    Allergies Allergies  Allergen Reactions   Aceon [Perindopril Erbumine]     cough   Codeine    Crestor [Rosuvastatin Calcium]     aching   Diovan [Valsartan]     Dizzy,rash   Iodine Other (See Comments)    Tongue swells per patient   Norvasc [Amlodipine Besylate]    Zetia [Ezetimibe]     cramps    Level of Care/Admitting Diagnosis ED Disposition     ED Disposition  Admit   Condition  --   Comment  Hospital Area: [facility] [[id number]]  Level of Care: Med-Surg [16]  May place patient in observation at [facility] or [facility] if equivalent level of care is available:: No  Covid Evaluation: Confirmed COVID Negative  Diagnosis: Influenza A [id number]  Admitting Physician: [id number]] Charlsie Quest  Attending Physician: [[id number]] Charlsie Quest          B Medical/Surgery History Past Medical History:  Diagnosis Date   Hypercholesterolemia    Hypertension    Situational stress    Stress fracture    right foot   Past Surgical History:  Procedure Laterality Date   OTHER SURGICAL HISTORY  1973   hysterectomy   RECTAL SURGERY       A IV Location/Drains/Wounds Patient Lines/Drains/Airways Status     Active Line/Drains/Airways     Name Placement date Placement time Site Days   Peripheral IV 07/03/22 20 G Anterior;Left Forearm 07/03/22  1621  Forearm  less than 1            Intake/Output Last 24 hours  Intake/Output Summary  (Last 24 hours) at 07/03/2022 1930 Last data filed at 07/03/2022 1755 Gross per 24 hour  Intake 1004.42 ml  Output --  Net 1004.42 ml    Labs/Imaging Results for orders placed or performed during the hospital encounter of 07/03/22 (from the past 48 hour(s))  POC CBG, ED     Status: Abnormal   Collection Time: 07/03/22  2:46 PM  Result Value Ref Range   Glucose-Capillary 109 (H) 70 - 99 mg/dL    Comment: Glucose reference range applies only to samples taken after fasting for at least 8 hours.  CBC with Differential     Status: Abnormal   Collection Time: 07/03/22  2:48 PM  Result Value Ref Range   WBC 8.4 4.0 - 10.5 K/uL   RBC 4.87 3.87 - 5.11 MIL/uL   Hemoglobin 15.2 (H) 12.0 - 15.0 g/dL   HCT 07/05/22 (H) 65.7 - 84.6 %   MCV 95.1 80.0 - 100.0 fL   MCH 31.2 26.0 - 34.0 pg   MCHC 32.8 30.0 - 36.0 g/dL   RDW 96.2 95.2 - 84.1 %   Platelets 192 150 - 400 K/uL   nRBC 0.0 0.0 - 0.2 %   Neutrophils Relative % 74 %   Neutro Abs 6.2 1.7 - 7.7 K/uL   Lymphocytes Relative 14 %   Lymphs Abs  1.2 0.7 - 4.0 K/uL   Monocytes Relative 10 %   Monocytes Absolute 0.8 0.1 - 1.0 K/uL   Eosinophils Relative 1 %   Eosinophils Absolute 0.1 0.0 - 0.5 K/uL   Basophils Relative 0 %   Basophils Absolute 0.0 0.0 - 0.1 K/uL   Immature Granulocytes 1 %   Abs Immature Granulocytes 0.09 (H) 0.00 - 0.07 K/uL    Comment: Performed at [facility], [address] [place], [place] [address]  Comprehensive metabolic panel     Status: Abnormal   Collection Time: 07/03/22  2:48 PM  Result Value Ref Range   Sodium 139 135 - 145 mmol/L   Potassium 3.6 3.5 - 5.1 mmol/L   Chloride 106 98 - 111 mmol/L   CO2 25 22 - 32 mmol/L   Glucose, Bld 114 (H) 70 - 99 mg/dL    Comment: Glucose reference range applies only to samples taken after fasting for at least 8 hours.   BUN 14 8 - 23 mg/dL   Creatinine, Ser 4.780.94 0.44 - 1.00 mg/dL   Calcium 8.7 (L) 8.9 - 10.3 mg/dL   Total Protein 6.5 6.5 - 8.1 g/dL    Albumin 3.3 (L) 3.5 - 5.0 g/dL   AST 24 15 - 41 U/L   ALT 23 0 - 44 U/L   Alkaline Phosphatase 40 38 - 126 U/L   Total Bilirubin 0.8 0.3 - 1.2 mg/dL   GFR, Estimated >29>60 >56>60 mL/min    Comment: (NOTE) Calculated using the CKD-EPI Creatinine Equation (2021)    Anion gap 8 5 - 15    Comment: Performed at [facility], [address] [place], [place] [address]  Lipase, blood     Status: None   Collection Time: 07/03/22  2:48 PM  Result Value Ref Range   Lipase 29 11 - 51 U/L    Comment: Performed at [facility], [address] [place], [place] [address]  Resp panel by RT-PCR (RSV, Flu A&B, Covid) Anterior Nasal Swab     Status: Abnormal   Collection Time: 07/03/22  2:49 PM   Specimen: Anterior Nasal Swab  Result Value Ref Range   SARS Coronavirus 2 by RT PCR NEGATIVE NEGATIVE    Comment: (NOTE) SARS-CoV-2 target nucleic acids are NOT DETECTED.  The SARS-CoV-2 RNA is generally detectable in upper respiratory specimens during the acute phase of infection. The lowest concentration of SARS-CoV-2 viral copies this assay can detect is 138 copies/mL. A negative result does not preclude SARS-Cov-2 infection and should not be used as the sole basis for treatment or other patient management decisions. A negative result may occur with  improper specimen collection/handling, submission of specimen other than nasopharyngeal swab, presence of viral mutation(s) within the areas targeted by this assay, and inadequate number of viral copies(<138 copies/mL). A negative result must be combined with clinical observations, patient history, and epidemiological information. The expected result is Negative.  Fact Sheet for Patients:  BloggerCourse.comhttps://www.fda.gov/media/152166/download  Fact Sheet for Healthcare Providers:  SeriousBroker.ithttps://www.fda.gov/media/152162/download  This test is no t yet approved or cleared by the [place] FDA and  has been authorized for  detection and/or diagnosis of SARS-CoV-2 by FDA under an Emergency Use Authorization (EUA). This EUA will remain  in effect (meaning this test can be used) for the duration of the COVID-19 declaration under Section 564(b)(1) of the Act, 21 U.S.C.section 360bbb-3(b)(1), unless the authorization is terminated  or revoked sooner.  Influenza A by PCR POSITIVE (A) NEGATIVE   Influenza B by PCR NEGATIVE NEGATIVE    Comment: (NOTE) The Xpert Xpress SARS-CoV-2/FLU/RSV plus assay is intended as an aid in the diagnosis of influenza from Nasopharyngeal swab specimens and should not be used as a sole basis for treatment. Nasal washings and aspirates are unacceptable for Xpert Xpress SARS-CoV-2/FLU/RSV testing.  Fact Sheet for Patients: BloggerCourse.com  Fact Sheet for Healthcare Providers: SeriousBroker.it  This test is not yet approved or cleared by the [place] FDA and has been authorized for detection and/or diagnosis of SARS-CoV-2 by FDA under an Emergency Use Authorization (EUA). This EUA will remain in effect (meaning this test can be used) for the duration of the COVID-19 declaration under Section 564(b)(1) of the Act, 21 U.S.C. section 360bbb-3(b)(1), unless the authorization is terminated or revoked.     Resp Syncytial Virus by PCR NEGATIVE NEGATIVE    Comment: (NOTE) Fact Sheet for Patients: BloggerCourse.com  Fact Sheet for Healthcare Providers: SeriousBroker.it  This test is not yet approved or cleared by the [place] FDA and has been authorized for detection and/or diagnosis of SARS-CoV-2 by FDA under an Emergency Use Authorization (EUA). This EUA will remain in effect (meaning this test can be used) for the duration of the COVID-19 declaration under Section 564(b)(1) of the Act, 21 U.S.C. section 360bbb-3(b)(1), unless the authorization is terminated  or revoked.  Performed at [facility], [address], [place], [place] [address]   I-stat chem 8, ED (not at [facility], [facility] or [facility])     Status: Abnormal   Collection Time: 07/03/22  2:54 PM  Result Value Ref Range   Sodium 139 135 - 145 mmol/L   Potassium 3.5 3.5 - 5.1 mmol/L   Chloride 103 98 - 111 mmol/L   BUN 12 8 - 23 mg/dL   Creatinine, Ser 5.72 0.44 - 1.00 mg/dL   Glucose, Bld 620 (H) 70 - 99 mg/dL    Comment: Glucose reference range applies only to samples taken after fasting for at least 8 hours.   Calcium, Ion 1.10 (L) 1.15 - 1.40 mmol/L   TCO2 24 22 - 32 mmol/L   Hemoglobin 15.0 12.0 - 15.0 g/dL   HCT 35.5 97.4 - 16.3 %  Blood gas, venous     Status: Abnormal   Collection Time: 07/03/22  5:19 PM  Result Value Ref Range   pH, Ven 7.4 7.25 - 7.43   pCO2, Ven 47 44 - 60 mmHg   pO2, Ven <31 (LL) 32 - 45 mmHg    Comment: CRITICAL RESULT CALLED TO, READ BACK BY AND VERIFIED WITH: Katha Cabal, RN 07/03/22 1730 J. COLE    Bicarbonate 29.1 (H) 20.0 - 28.0 mmol/L   Acid-Base Excess 3.5 (H) 0.0 - 2.0 mmol/L   O2 Saturation 52.8 %   Patient temperature 37.0     Comment: Performed at [facility], [address], [place], [place] [address]   CT Head Wo Contrast  Result Date: 07/03/2022 CLINICAL DATA:  Weakness, nausea and vomiting for several days, polyuria EXAM: CT HEAD WITHOUT CONTRAST TECHNIQUE: Contiguous axial images were obtained from the base of the skull through the vertex without intravenous contrast. RADIATION DOSE REDUCTION: This exam was performed according to the departmental dose-optimization program which includes automated exposure control, adjustment of the mA and/or kV according to patient size and/or use of iterative reconstruction technique. COMPARISON:  04/28/2017 FINDINGS: Brain: No acute infarct or hemorrhage. Lateral ventricles and  midline structures are unremarkable. No acute extra-axial fluid collections. No mass effect.  Vascular: No hyperdense vessel or unexpected calcification. Skull: Normal. Negative for fracture or focal lesion. Sinuses/Orbits: Mild mucosal thickening within the anterior left ethmoid air cells. The remaining paranasal sinuses are clear. Other: None. IMPRESSION: 1. No acute intracranial process. Electronically Signed   By: Sharlet Salina M.D.   On: 07/03/2022 15:43   DG Chest 1 View  Result Date: 07/03/2022 CLINICAL DATA:  Cough and weakness for several days, polyuria EXAM: CHEST  1 VIEW COMPARISON:  09/27/2004 FINDINGS: Single frontal view of the chest demonstrates an unremarkable cardiac silhouette. No acute airspace disease, effusion, or pneumothorax. No acute bony abnormalities. IMPRESSION: 1. No acute intrathoracic process. Electronically Signed   By: Sharlet Salina M.D.   On: 07/03/2022 15:08    Pending Labs Unresulted Labs (From admission, onward)     Start     Ordered   07/03/22 1438  Urinalysis, Routine w reflex microscopic Urine, Clean Catch  Once,   URGENT        07/03/22 1437            Vitals/Pain Today's Vitals   07/03/22 1815 07/03/22 1830 07/03/22 1845 07/03/22 1900  BP: 120/71 (!) 114/55 130/64 130/67  Pulse: 89 69 86 71  Resp: (!) 29 (!) 26 (!) 27 (!) 22  Temp:      TempSrc:      SpO2: 98% 96% 97% 96%  Weight:      Height:      PainSc:        Isolation Precautions No active isolations  Medications Medications  lactated ringers bolus 1,000 mL (0 mLs Intravenous Stopped 07/03/22 1755)  albuterol (PROVENTIL) (2.5 MG/3ML) 0.083% nebulizer solution 5 mg (5 mg Nebulization Given 07/03/22 1905)  oseltamivir (TAMIFLU) capsule 75 mg (75 mg Oral Given 07/03/22 1905)    Mobility walks Low fall risk unable to walk at this time due to weakness    Focused Assessments    R Recommendations: See Admitting Provider Note  Report given to:   Additional Notes:

## 2022-07-03 NOTE — Hospital Course (Signed)
Cynthia Jenkins is a 81 y.o. female with medical history significant for HTN, HLD who is admitted with influenza A.

## 2022-07-03 NOTE — ED Notes (Signed)
Placed the patient on 2L of oxygen 

## 2022-07-03 NOTE — ED Provider Triage Note (Signed)
Emergency Medicine Provider Triage Evaluation Note  Cynthia Jenkins , a 81 y.o. female  was evaluated in triage.  Pt complains of cough for the past few days and generalized weakness that started earlier today. Husband at bedside notes patient was so weak earlier today that she was unable to get up. Admits to increased urination. Husband notes he wouldn't classify her as vomiting, more spitting up. Patient denies abdominal pain and chest pain.   Review of Systems  Positive: weakness Negative: fever  Physical Exam  BP 122/69 (BP Location: Left Arm)   Pulse 87   Temp 98.3 F (36.8 C) (Oral)   Resp 14   Ht 5\' 7"  (1.702 m)   Wt 75 kg   SpO2 97%   BMI 25.90 kg/m  Gen:   Awake, no distress   Resp:  Normal effort  MSK:   Moves extremities without difficulty  Other:  Equal grip strength, no pronator drift. No facial droop  Medical Decision Making  Medically screening exam initiated at 2:37 PM.  Appropriate orders placed.  was informed that the remainder of the evaluation will be completed by another provider, this initial triage assessment does not replace that evaluation, and the importance of remaining in the ED until their evaluation is complete.  Labs ordered CXR to rule out infection  RVP   Revonda Humphrey, Mannie Stabile 07/03/22 1439

## 2022-07-03 NOTE — H&P (Signed)
History and Physical    Cynthia Jenkins OHY:[id number] DOB: Jul 16, 1940 DOA: 07/03/2022  PCP: Chilton Greathouse, MD  Patient coming from: Home  I have personally briefly reviewed patient's old medical records in [facility]  Chief Complaint: Generalized weakness  HPI: Cynthia Jenkins is a 81 y.o. female with medical history significant for HTN, HLD who presented to the ED for evaluation of generalized weakness.  Patient reports 2 days of progressively worsening generalized weakness with dry cough.  Normally ambulates on her own but could not stand even with family assistance today due to feeling so weak.  She has had some body aches, subjective fever, chills, and diaphoresis.  Reports some nausea with emesis today.  Denies chest pain, abdominal pain, dysuria.  ED Course  Labs/Imaging on admission: I have personally reviewed following labs and imaging studies.  Initial vitals showed BP 122/69, pulse 87, RR 14, temp 98.3 F, SpO2 97% on room air.  Per ED documentation, patient had a brief unresponsive episode while in the waiting room which resolved without any interventions after a few minutes.  Labs showed WBC 8.4, hemoglobin 15.2, platelets 192,000, sodium 139, potassium 3.6, bicarb 25, BUN 14, creatinine 0.94, serum glucose 114, lipase 29.  Influenza A is positive.  COVID and RSV negative.  Portable chest x-ray negative for acute intrathoracic process.  CT head without contrast negative for acute intracranial process.  Patient was given 1 L LR, albuterol nebulizer.  The hospitalist service was consulted to admit for further evaluation and management.  Review of Systems: All systems reviewed and are negative except as documented in history of present illness above.   Past Medical History:  Diagnosis Date   Hypercholesterolemia    Hypertension    Situational stress    Stress fracture    right foot    Past Surgical History:  Procedure Laterality Date   OTHER SURGICAL  HISTORY  1973   hysterectomy   RECTAL SURGERY      Social History:  reports that she has quit smoking. Her smoking use included cigarettes. She has never used smokeless tobacco. She reports that she does not drink alcohol and does not use drugs.  Allergies  Allergen Reactions   Aceon [Perindopril Erbumine]     cough   Codeine    Crestor [Rosuvastatin Calcium]     aching   Diovan [Valsartan]     Dizzy,rash   Iodine Other (See Comments)    Tongue swells per patient   Norvasc [Amlodipine Besylate]    Zetia [Ezetimibe]     cramps    Family History  Problem Relation Age of Onset   Heart disease Mother    Diabetes Mother    Emphysema Father    Ovarian cancer Sister    Breast cancer Sister    Coronary artery disease Brother      Prior to Admission medications   Medication Sig Start Date End Date Taking? Authorizing Provider  amLODipine (NORVASC) 5 MG tablet amlodipine 5 mg tablet    [provider]  aspirin 81 MG tablet Take 81 mg by mouth daily.      [provider]  Aspirin-Calcium Carbonate 81-777 MG TABS Take by mouth.    [provider]  buPROPion (WELLBUTRIN XL) 150 MG 24 hr tablet Take 150 mg by mouth daily. 04/01/17   [provider]  carvedilol (COREG) 12.5 MG tablet Take 12.5 mg by mouth 2 (two) times daily with a meal.    [provider]  Cholecalciferol (VITAMIN D PO) Take 1 tablet by mouth daily.     [provider]  Cyanocobalamin (VITAMIN B 12 PO) Take 1 tablet by mouth daily.     [provider]  fluticasone Aleda Grana) 50 MCG/ACT nasal spray  06/08/18   [provider]  guaiFENesin (MUCINEX) 600 MG 12 hr tablet Take 1 tablet (600 mg total) by mouth 2 (two) times daily as needed for congestion. Patient not taking: Reported on 04/28/2017 05/20/12   Cassell Clement, MD  loratadine (CLARITIN) 10 MG tablet Allergy Relief (loratadine) 10 mg tablet    [provider]  losartan (COZAAR)  100 MG tablet  07/15/18   [provider]  losartan-hydrochlorothiazide (HYZAAR) 100-12.5 MG per tablet Take 1 tablet by mouth daily. 09/13/14   Cassell Clement, MD  metroNIDAZOLE (METROGEL) 0.75 % gel  08/24/18   [provider]  Omega-3 Fatty Acids (FISH OIL PO) Take 1 capsule by mouth daily.     [provider]  Omeprazole (PRILOSEC PO) Take 1 tablet by mouth daily.     [provider]  omeprazole (PRILOSEC) 40 MG capsule omeprazole 40 mg capsule,delayed release 06/23/18   [provider]  ondansetron (ZOFRAN-ODT) 8 MG disintegrating tablet ondansetron 8 mg disintegrating tablet    [provider]  pravastatin (PRAVACHOL) 40 MG tablet Take 1 tablet (40 mg total) by mouth daily. 09/13/14   Cassell Clement, MD  traMADol (ULTRAM) 50 MG tablet Take 1 tablet (50 mg total) by mouth every 6 (six) hours as needed. 04/28/17   Arthor Captain, PA-C    Physical Exam: Vitals:   07/03/22 1900 07/03/22 1915 07/03/22 1930 07/03/22 2030  BP: 130/67 124/66  119/64  Pulse: 71 74 72 76  Resp: (!) 22 20 19 18   Temp:    (!) 101.3 F (38.5 C)  TempSrc:    Oral  SpO2: 96% 99% 99% 95%  Weight:      Height:       Constitutional: Resting supine in bed, NAD, calm, comfortable Eyes: EOMI, lids and conjunctivae normal ENMT: Mucous membranes are moist. Posterior pharynx clear of any exudate or lesions.Normal dentition.  Neck: normal, supple, no masses. Respiratory: clear to auscultation bilaterally, no wheezing, no crackles. Normal respiratory effort. No accessory muscle use.  Cardiovascular: Regular rate and rhythm, no murmurs / rubs / gallops. No extremity edema. 2+ pedal pulses. Abdomen: no tenderness, no masses palpated.  Musculoskeletal: no clubbing / cyanosis. No joint deformity upper and lower extremities. Good ROM, no contractures. Normal muscle tone.  Skin: no rashes, lesions, ulcers. No induration Neurologic: Sensation intact. Strength 5/5 in all 4.   Psychiatric: Alert and oriented x 3. Normal mood.   EKG: Personally reviewed. Sinus rhythm, rate 60, no acute ischemic changes.  Similar to prior.  Assessment/Plan Principal Problem:   Influenza A Active Problems:   Generalized weakness   Essential hypertension   Cynthia Jenkins is a 81 y.o. female with medical history significant for HTN, HLD who is admitted with influenza A.  Assessment and Plan: * Influenza A Fever with deconditioning/generalized weakness due to influenza A.  SpO2 dropped to 89% while on room air in the ED and she was placed on 2 L O2 via [place].  CXR without evidence of pneumonia or other infiltrate. -Continue Tamiflu -IV fluid hydration overnight -Albuterol, incentive spirometer, flutter valve, supplemental O2 as needed  Essential hypertension BP stable, holding antihypertensives for now.  DVT prophylaxis: enoxaparin (LOVENOX) injection 40 mg Start: 07/03/22 2200 Code  Status: Full code, confirmed with patient on admission Family Communication: Discussed with patient, she has discussed with family Disposition Plan: From home and likely discharge to home pending clinical progress Consults called: None Severity of Illness: The appropriate patient status for this patient is OBSERVATION. Observation status is judged to be reasonable and necessary in order to provide the required intensity of service to ensure the patient's safety. The patient's presenting symptoms, physical exam findings, and initial radiographic and laboratory data in the context of their medical condition is felt to place them at decreased risk for further clinical deterioration. Furthermore, it is anticipated that the patient will be medically stable for discharge from the hospital within 2 midnights of admission.   Darreld Mclean MD [facility]  If 7PM-7AM, please contact night-coverage [email or web address]  07/03/2022, 8:55 PM

## 2022-07-03 NOTE — Assessment & Plan Note (Signed)
Fever with deconditioning/generalized weakness due to influenza A.  SpO2 dropped to 89% while on room air in the ED and she was placed on 2 L O2 via [place].  CXR without evidence of pneumonia or other infiltrate. -Continue Tamiflu -IV fluid hydration overnight -Albuterol, incentive spirometer, flutter valve, supplemental O2 as needed

## 2022-07-03 NOTE — Assessment & Plan Note (Signed)
BP stable, holding antihypertensives for now.

## 2022-07-03 NOTE — ED Provider Notes (Signed)
[facility]-EMERGENCY DEPT Provider Note   CSN: [id number] Arrival date & time: 07/03/22  1402     History  Chief Complaint  Patient presents with   Weakness    Cynthia Jenkins is a 81 y.o. female.  Patient is an 81 year old female with a history of hypertension, hyperlipidemia who is presenting today with her husband due to generalized weakness and a dry cough.  Symptoms started 2 days ago and have been gradually getting worse.  Husband reports that she has felt feverish and chilled but he has not documented a high fever.  He did get her some Mucinex but reports nothing has come up and the cough is just hacking.  She has not complained of any shortness of breath and denies shortness of breath now.  She denies any chest pain or abdominal pain but husband reports she has been nauseated and had some dry heaving today as well as no oral intake today and very little food yesterday and the day before.  She has urinated several times today but it has been very little.  She denies any dysuria frequency or urgency.  While waiting in the waiting room she had an event where her husband reports she just closed her eyes and was very hard to arouse.  Staff evaluated her and with intervention with stimulation she was able to wake up.  Concern for possible near syncope.  No recent medication changes or known sick contacts.  No history of lung disease.  Patient does not use inhalers regularly.  The history is provided by the patient and the spouse.  Weakness      Home Medications Prior to Admission medications   Medication Sig Start Date End Date Taking? Authorizing Provider  amLODipine (NORVASC) 5 MG tablet amlodipine 5 mg tablet    [provider]  aspirin 81 MG tablet Take 81 mg by mouth daily.      [provider]  Aspirin-Calcium Carbonate 81-777 MG TABS Take by mouth.    [provider]  buPROPion (WELLBUTRIN XL) 150 MG 24 hr tablet Take 150 mg by  mouth daily. 04/01/17   [provider]  carvedilol (COREG) 12.5 MG tablet Take 12.5 mg by mouth 2 (two) times daily with a meal.    [provider]  Cholecalciferol (VITAMIN D PO) Take 1 tablet by mouth daily.     [provider]  Cyanocobalamin (VITAMIN B 12 PO) Take 1 tablet by mouth daily.     [provider]  fluticasone Aleda Grana) 50 MCG/ACT nasal spray  06/08/18   [provider]  guaiFENesin (MUCINEX) 600 MG 12 hr tablet Take 1 tablet (600 mg total) by mouth 2 (two) times daily as needed for congestion. Patient not taking: Reported on 04/28/2017 05/20/12   Cassell Clement, MD  loratadine (CLARITIN) 10 MG tablet Allergy Relief (loratadine) 10 mg tablet    [provider]  losartan (COZAAR) 100 MG tablet  07/15/18   [provider]  losartan-hydrochlorothiazide (HYZAAR) 100-12.5 MG per tablet Take 1 tablet by mouth daily. 09/13/14   Cassell Clement, MD  metroNIDAZOLE (METROGEL) 0.75 % gel  08/24/18   [provider]  Omega-3 Fatty Acids (FISH OIL PO) Take 1 capsule by mouth daily.     [provider]  Omeprazole (PRILOSEC PO) Take 1 tablet by mouth daily.     [provider]  omeprazole (PRILOSEC) 40 MG capsule omeprazole 40 mg capsule,delayed release 06/23/18   [provider]  ondansetron (ZOFRAN-ODT) 8 MG disintegrating tablet ondansetron 8 mg disintegrating tablet    [provider]  pravastatin (PRAVACHOL) 40 MG tablet Take 1 tablet (40 mg total) by mouth daily. 09/13/14   Cassell Clement, MD  traMADol (ULTRAM) 50 MG tablet Take 1 tablet (50 mg total) by mouth every 6 (six) hours as needed. 04/28/17   Arthor Captain, PA-C      Allergies    Aceon [perindopril erbumine], Codeine, Crestor [rosuvastatin calcium], Diovan [valsartan], Iodine, Norvasc [amlodipine besylate], and Zetia [ezetimibe]    Review of Systems   Review of Systems  Neurological:  Positive for weakness.     Physical Exam Updated Vital Signs BP 130/71   Pulse 83   Temp 98.5 F (36.9 C) (Oral)   Resp (!) 27   Ht 5\' 7"  (1.702 m)   Wt 75 kg   SpO2 95%   BMI 25.90 kg/m  Physical Exam Vitals and nursing note reviewed.  Constitutional:      General: She is not in acute distress.    Appearance: She is well-developed.  HENT:     Head: Normocephalic and atraumatic.     Mouth/Throat:     Mouth: Mucous membranes are dry.  Eyes:     Pupils: Pupils are equal, round, and reactive to light.  Cardiovascular:     Rate and Rhythm: Normal rate and regular rhythm.     Heart sounds: Normal heart sounds. No murmur heard.    No friction rub.  Pulmonary:     Effort: Pulmonary effort is normal.     Breath sounds: Normal breath sounds. No wheezing or rales.     Comments: Poor inspiratory effort and audible wheezing which seems to be more upper airway Abdominal:     General: Bowel sounds are normal. There is no distension.     Palpations: Abdomen is soft.     Tenderness: There is no abdominal tenderness. There is no guarding or rebound.  Musculoskeletal:        General: No tenderness. Normal range of motion.     Right lower leg: No edema.     Left lower leg: No edema.     Comments: No edema  Skin:    General: Skin is warm and dry.     Findings: No rash.  Neurological:     Mental Status: She is alert.     Cranial Nerves: No cranial nerve deficit.     Comments: Listless but is able to respond to questions.  Extremely weak at this time.  She is unable to sit up on her own in the bed.  However she is able to move her arms and legs appropriately.  4 out of 5 strength  Psychiatric:        Behavior: Behavior normal.     ED Results / Procedures / Treatments   Labs (all labs ordered are listed, but only abnormal results are displayed) Labs Reviewed  RESP PANEL BY RT-PCR (RSV, FLU A&B, COVID)  RVPGX2 - Abnormal; Notable for the following components:      Result Value   Influenza A by PCR  POSITIVE (*)    All other components within normal limits  CBC WITH DIFFERENTIAL/PLATELET - Abnormal; Notable for the following components:   Hemoglobin 15.2 (*)    HCT 46.3 (*)    Abs Immature Granulocytes 0.09 (*)    All other components within normal limits  COMPREHENSIVE METABOLIC PANEL - Abnormal; Notable for the following components:   Glucose, Bld  114 (*)    Calcium 8.7 (*)    Albumin 3.3 (*)    All other components within normal limits  BLOOD GAS, VENOUS - Abnormal; Notable for the following components:   pO2, Ven <31 (*)    Bicarbonate 29.1 (*)    Acid-Base Excess 3.5 (*)    All other components within normal limits  I-STAT CHEM 8, ED - Abnormal; Notable for the following components:   Glucose, Bld 110 (*)    Calcium, Ion 1.10 (*)    All other components within normal limits  CBG MONITORING, ED - Abnormal; Notable for the following components:   Glucose-Capillary 109 (*)    All other components within normal limits  LIPASE, BLOOD  URINALYSIS, ROUTINE W REFLEX MICROSCOPIC    EKG EKG Interpretation  Date/Time:  Wednesday July 03 2022 14:44:15 EST Ventricular Rate:  60 PR Interval:  123 QRS Duration: 93 QT Interval:  374 QTC Calculation: 374 R Axis:   31 Text Interpretation: Sinus rhythm RSR' in V1 or V2, right VCD or RVH Confirmed by Vonita Moss [id number]) on 07/03/2022 2:51:01 PM  Radiology CT Head Wo Contrast  Result Date: 07/03/2022 CLINICAL DATA:  Weakness, nausea and vomiting for several days, polyuria EXAM: CT HEAD WITHOUT CONTRAST TECHNIQUE: Contiguous axial images were obtained from the base of the skull through the vertex without intravenous contrast. RADIATION DOSE REDUCTION: This exam was performed according to the departmental dose-optimization program which includes automated exposure control, adjustment of the mA and/or kV according to patient size and/or use of iterative reconstruction technique. COMPARISON:  04/28/2017 FINDINGS: Brain: No acute  infarct or hemorrhage. Lateral ventricles and midline structures are unremarkable. No acute extra-axial fluid collections. No mass effect. Vascular: No hyperdense vessel or unexpected calcification. Skull: Normal. Negative for fracture or focal lesion. Sinuses/Orbits: Mild mucosal thickening within the anterior left ethmoid air cells. The remaining paranasal sinuses are clear. Other: None. IMPRESSION: 1. No acute intracranial process. Electronically Signed   By: Sharlet Salina M.D.   On: 07/03/2022 15:43   DG Chest 1 View  Result Date: 07/03/2022 CLINICAL DATA:  Cough and weakness for several days, polyuria EXAM: CHEST  1 VIEW COMPARISON:  09/27/2004 FINDINGS: Single frontal view of the chest demonstrates an unremarkable cardiac silhouette. No acute airspace disease, effusion, or pneumothorax. No acute bony abnormalities. IMPRESSION: 1. No acute intrathoracic process. Electronically Signed   By: Sharlet Salina M.D.   On: 07/03/2022 15:08    Procedures Procedures    Medications Ordered in ED Medications  albuterol (PROVENTIL) (2.5 MG/3ML) 0.083% nebulizer solution 5 mg (has no administration in time range)  oseltamivir (TAMIFLU) capsule 75 mg (has no administration in time range)  lactated ringers bolus 1,000 mL (0 mLs Intravenous Stopped 07/03/22 1755)    ED Course/ Medical Decision Making/ A&P                           Medical Decision Making Amount and/or Complexity of Data Reviewed Independent Historian: spouse External Data Reviewed: notes. Labs: ordered. Decision-making details documented in ED Course. Radiology: ordered and independent interpretation performed. Decision-making details documented in ED Course. ECG/medicine tests: ordered and independent interpretation performed. Decision-making details documented in ED Course.  Risk Prescription drug management. Decision regarding hospitalization.   Pt with multiple medical problems and comorbidities and presenting today with a  complaint that caries a high risk for morbidity and mortality.  Here today with upper respiratory symptoms, poor oral intake and  generalized weakness.  Patient normally gets out of bed and can do things for herself without difficulty and has not been able to get out of the bed at all today.  Her family is literally been picking her up and carrying her to the bathroom.  She had an event where she was unresponsive while waiting in the waiting room that lasted approximately 3 minutes but resolved and there was no seizure-like activity at that time.  Concern for viral illness with dehydration versus UTI versus electrolyte abnormality versus AKI versus anemia or cyst pneumonia.  Patient's vital signs are normal.  Sats are 97% on room air.  She is wheezing some but seems more upper airway in nature.  Will give IV fluids.  I independently interpreted patient's EKG and labs.  EKG without acute findings today, CBC, BMP, lipase are all within normal limits.  I have independently visualized and interpreted pt's images today.  Chest x-ray within normal limits today.  Head CT is negative.  6:01 PM Patient came back positive for influenza A.  On repeat evaluation she is wheezing more and sats are in the 480s.  She was placed on 2 L of oxygen with improvement in oxygenation.  Will give albuterol.  Patient does seem a little brighter after IV fluids but is still not able to stand or ambulate.  Will admit for further care.  Spoke with the patient and her husband and they are comfortable with this plan.        Final Clinical Impression(s) / ED Diagnoses Final diagnoses:  Influenza A  Acute respiratory failure with hypoxia (HCC)  Weakness    Rx / DC Orders ED Discharge Orders     None         Gwyneth SproutPlunkett, Demiyah Fischbach, MD 07/03/22 1801

## 2022-07-04 DIAGNOSIS — J101 Influenza due to other identified influenza virus with other respiratory manifestations: Secondary | ICD-10-CM | POA: Diagnosis not present

## 2022-07-04 LAB — CBC
HCT: 41.9 % (ref 36.0–46.0)
Hemoglobin: 13.3 g/dL (ref 12.0–15.0)
MCH: 31.4 pg (ref 26.0–34.0)
MCHC: 31.7 g/dL (ref 30.0–36.0)
MCV: 98.8 fL (ref 80.0–100.0)
Platelets: 149 10*3/uL — ABNORMAL LOW (ref 150–400)
RBC: 4.24 MIL/uL (ref 3.87–5.11)
RDW: 13.4 % (ref 11.5–15.5)
WBC: 6.5 10*3/uL (ref 4.0–10.5)
nRBC: 0 % (ref 0.0–0.2)

## 2022-07-04 LAB — BASIC METABOLIC PANEL
Anion gap: 6 (ref 5–15)
BUN: 12 mg/dL (ref 8–23)
CO2: 28 mmol/L (ref 22–32)
Calcium: 8 mg/dL — ABNORMAL LOW (ref 8.9–10.3)
Chloride: 106 mmol/L (ref 98–111)
Creatinine, Ser: 0.99 mg/dL (ref 0.44–1.00)
GFR, Estimated: 57 mL/min — ABNORMAL LOW (ref >60)
Glucose, Bld: 94 mg/dL (ref 70–99)
Potassium: 3.9 mmol/L (ref 3.5–5.1)
Sodium: 140 mmol/L (ref 135–145)

## 2022-07-04 MED ORDER — LOSARTAN POTASSIUM-HCTZ 100-12.5 MG PO TABS: 1.0000 | ORAL_TABLET | Freq: Every day | ORAL | Status: DC

## 2022-07-04 MED ORDER — SODIUM CHLORIDE 0.9 % IV SOLN
1.0000 g | INTRAVENOUS | Status: DC
  Administered 2022-07-04 – 2022-07-06 (×3): 1 g via INTRAVENOUS
  Filled 2022-07-04 (×4): qty 10

## 2022-07-04 MED ORDER — LOSARTAN POTASSIUM 50 MG PO TABS
100.0000 mg | ORAL_TABLET | Freq: Every day | ORAL | Status: DC
  Administered 2022-07-04 – 2022-07-06 (×3): 100 mg via ORAL
  Filled 2022-07-04 (×4): qty 2

## 2022-07-04 MED ORDER — PRAVASTATIN SODIUM 20 MG PO TABS
40.0000 mg | ORAL_TABLET | Freq: Every day | ORAL | Status: DC
  Administered 2022-07-04 – 2022-07-05 (×2): 40 mg via ORAL
  Filled 2022-07-04 (×2): qty 2

## 2022-07-04 MED ORDER — HYDROCHLOROTHIAZIDE 12.5 MG PO TABS
12.5000 mg | ORAL_TABLET | Freq: Every day | ORAL | Status: DC
  Administered 2022-07-04 – 2022-07-06 (×3): 12.5 mg via ORAL
  Filled 2022-07-04 (×3): qty 1

## 2022-07-04 MED ORDER — CARVEDILOL 12.5 MG PO TABS
12.5000 mg | ORAL_TABLET | Freq: Two times a day (BID) | ORAL | Status: DC
  Administered 2022-07-04 – 2022-07-06 (×4): 12.5 mg via ORAL
  Filled 2022-07-04 (×4): qty 1

## 2022-07-04 MED ORDER — BUPROPION HCL ER (XL) 150 MG PO TB24
150.0000 mg | ORAL_TABLET | Freq: Every day | ORAL | Status: DC
  Administered 2022-07-04 – 2022-07-06 (×3): 150 mg via ORAL
  Filled 2022-07-04 (×3): qty 1

## 2022-07-04 MED ORDER — ASPIRIN 81 MG PO CHEW
81.0000 mg | CHEWABLE_TABLET | Freq: Every day | ORAL | Status: DC
  Administered 2022-07-04 – 2022-07-06 (×3): 81 mg via ORAL
  Filled 2022-07-04 (×3): qty 1

## 2022-07-04 MED ORDER — AMLODIPINE BESYLATE 5 MG PO TABS
5.0000 mg | ORAL_TABLET | Freq: Every day | ORAL | Status: DC
  Administered 2022-07-04 – 2022-07-06 (×3): 5 mg via ORAL
  Filled 2022-07-04 (×3): qty 1

## 2022-07-04 MED ORDER — SODIUM CHLORIDE 0.9 % IV SOLN: INTRAVENOUS | Status: DC

## 2022-07-04 NOTE — Progress Notes (Addendum)
Mobility Specialist - Progress Note   07/04/22 1358  Oxygen Therapy  O2 Device Nasal Cannula  O2 Flow Rate (L/min) 2 L/min  Mobility  Activity Ambulated with assistance in hallway  Level of Assistance Contact guard assist, steadying assist  Assistive Device Front wheel walker  Distance Ambulated (ft) 40 ft  Activity Response Tolerated well  Mobility Referral Yes  $Mobility charge 1 Mobility   Pt received in bed and agreeable to mobility. Due to walked not in room, pt was limited to distance. Pt did have some dizziness upon sitting up but stated that it was due to not getting up in a while. Vitals checked & were normal. Pt is MinA from sit-to-stand & contact during ambulation due to not having access to a walker. No other complaints during mobility. Pt to bed after session with all needs met.    [facility]

## 2022-07-04 NOTE — Progress Notes (Signed)
PROGRESS NOTE    Cynthia Jenkins  SAY:[id number] DOB: Jan 31, 1941 DOA: 07/03/2022 PCP: Chilton Greathouse, MD     Brief Narrative:  Cynthia Jenkins is an 81 y.o. female with medical history significant for HTN, HLD who presented to the ED for evaluation of generalized weakness. Patient reports 2 days of progressively worsening generalized weakness with dry cough. Normally ambulates on her own but could not stand even with family assistance today due to feeling so weak. She has had some body aches, subjective fever, chills, and diaphoresis. She tested positive for influenza.   New events last 24 hours / Subjective: Has not had much to eat over the last 2 days due to decrease in appetite.  Waiting for breakfast to get here.  Has not really been drinking much either.  Initially denied dysuria, although husband says that she has been complaining of burning with urination at home.  She is also had history of frequent urinary tract infections.  She also had a fever yesterday 101.3  Assessment & Plan:   Principal Problem:   Influenza A Active Problems:   Generalized weakness   Hypercholesterolemia   Essential hypertension   Influenza A -Tamiflu -IV fluid and supportive care  UTI -Urine culture ordered -Empiric Rocephin  Hypertension -Norvasc, losartan, HCTZ  Hyperlipidemia -Pravachol   DVT prophylaxis:  enoxaparin (LOVENOX) injection 40 mg Start: 07/03/22 2200  Code Status: Full Family Communication: Husband at bedside  Disposition Plan:  Status is: Observation The patient will require care spanning > 2 midnights and should be moved to inpatient because: IVF and IV antibiotics    Antimicrobials:  Anti-infectives (From admission, onward)    Start     Dose/Rate Route Frequency Ordered Stop   07/04/22 1000  oseltamivir (TAMIFLU) capsule 75 mg  Status:  Discontinued        75 mg Oral 2 times daily 07/03/22 2004 07/03/22 2031   07/04/22 1000  oseltamivir (TAMIFLU) capsule 30 mg         30 mg Oral 2 times daily 07/03/22 2031 07/08/22 2159   07/04/22 0830  cefTRIAXone (ROCEPHIN) 1 g in sodium chloride 0.9 % 100 mL IVPB        1 g 200 mL/hr over 30 Minutes Intravenous Every 24 hours 07/04/22 0730     07/03/22 1800  oseltamivir (TAMIFLU) capsule 75 mg        75 mg Oral  Once 07/03/22 1749 07/03/22 1905        Objective: Vitals:   07/03/22 2030 07/04/22 0152 07/04/22 0627 07/04/22 1013  BP: 119/64 126/76 131/61 122/60  Pulse: 76 72 62 70  Resp: 18 18 18 18   Temp: (!) 101.3 F (38.5 C) 98.1 F (36.7 C) 98.2 F (36.8 C) 98.3 F (36.8 C)  TempSrc: Oral Oral Oral Oral  SpO2: 95% 98% 96% 98%  Weight:      Height:        Intake/Output Summary (Last 24 hours) at 07/04/2022 1307 Last data filed at 07/04/2022 1000 Gross per 24 hour  Intake 1707.06 ml  Output 900 ml  Net 807.06 ml   Filed Weights   07/03/22 1424  Weight: 75 kg    Examination:  General exam: Appears calm and comfortable  Respiratory system: Clear to auscultation. Respiratory effort normal. No respiratory distress. No conversational dyspnea.  Cardiovascular system: S1 & S2 heard, RRR. No murmurs. No pedal edema. Gastrointestinal system: Abdomen is nondistended, soft and nontender. Normal bowel sounds heard. Central nervous system: Alert and  oriented. No focal neurological deficits. Speech clear.  Extremities: Symmetric in appearance  Skin: No rashes, lesions or ulcers on exposed skin  Psychiatry: Judgement and insight appear normal. Mood & affect appropriate.   Data Reviewed: I have personally reviewed following labs and imaging studies  CBC: Recent Labs  Lab 07/03/22 1448 07/03/22 1454 07/04/22 0529  WBC 8.4  --  6.5  NEUTROABS 6.2  --   --   HGB 15.2* 15.0 13.3  HCT 46.3* 44.0 41.9  MCV 95.1  --  98.8  PLT 192  --  149*   Basic Metabolic Panel: Recent Labs  Lab 07/03/22 1448 07/03/22 1454 07/04/22 0529  NA 139 139 140  K 3.6 3.5 3.9  CL 106 103 106  CO2 25  --  28   GLUCOSE 114* 110* 94  BUN 14 12 12   CREATININE 0.94 0.80 0.99  CALCIUM 8.7*  --  8.0*   GFR: Estimated Creatinine Clearance: 46.3 mL/min (by C-G formula based on SCr of 0.99 mg/dL). Liver Function Tests: Recent Labs  Lab 07/03/22 1448  AST 24  ALT 23  ALKPHOS 40  BILITOT 0.8  PROT 6.5  ALBUMIN 3.3*   Recent Labs  Lab 07/03/22 1448  LIPASE 29   No results for input(s): "AMMONIA" in the last 168 hours. Coagulation Profile: No results for input(s): "INR", "PROTIME" in the last 168 hours. Cardiac Enzymes: No results for input(s): "CKTOTAL", "CKMB", "CKMBINDEX", "TROPONINI" in the last 168 hours. BNP (last 3 results) No results for input(s): "PROBNP" in the last 8760 hours. HbA1C: No results for input(s): "HGBA1C" in the last 72 hours. CBG: Recent Labs  Lab 07/03/22 1446  GLUCAP 109*   Lipid Profile: No results for input(s): "CHOL", "HDL", "LDLCALC", "TRIG", "CHOLHDL", "LDLDIRECT" in the last 72 hours. Thyroid Function Tests: No results for input(s): "TSH", "T4TOTAL", "FREET4", "T3FREE", "THYROIDAB" in the last 72 hours. Anemia Panel: No results for input(s): "VITAMINB12", "FOLATE", "FERRITIN", "TIBC", "IRON", "RETICCTPCT" in the last 72 hours. Sepsis Labs: No results for input(s): "PROCALCITON", "LATICACIDVEN" in the last 168 hours.  Recent Results (from the past 240 hour(s))  Resp panel by RT-PCR (RSV, Flu A&B, Covid) Anterior Nasal Swab     Status: Abnormal   Collection Time: 07/03/22  2:49 PM   Specimen: Anterior Nasal Swab  Result Value Ref Range Status   SARS Coronavirus 2 by RT PCR NEGATIVE NEGATIVE Final    Comment: (NOTE) SARS-CoV-2 target nucleic acids are NOT DETECTED.  The SARS-CoV-2 RNA is generally detectable in upper respiratory specimens during the acute phase of infection. The lowest concentration of SARS-CoV-2 viral copies this assay can detect is 138 copies/mL. A negative result does not preclude SARS-Cov-2 infection and should not be used  as the sole basis for treatment or other patient management decisions. A negative result may occur with  improper specimen collection/handling, submission of specimen other than nasopharyngeal swab, presence of viral mutation(s) within the areas targeted by this assay, and inadequate number of viral copies(<138 copies/mL). A negative result must be combined with clinical observations, patient history, and epidemiological information. The expected result is Negative.  Fact Sheet for Patients:  BloggerCourse.comhttps://www.fda.gov/media/152166/download  Fact Sheet for Healthcare Providers:  SeriousBroker.ithttps://www.fda.gov/media/152162/download  This test is no t yet approved or cleared by the [place] FDA and  has been authorized for detection and/or diagnosis of SARS-CoV-2 by FDA under an Emergency Use Authorization (EUA). This EUA will remain  in effect (meaning this test can be used) for the duration of the  COVID-19 declaration under Section 564(b)(1) of the Act, 21 U.S.C.section 360bbb-3(b)(1), unless the authorization is terminated  or revoked sooner.       Influenza A by PCR POSITIVE (A) NEGATIVE Final   Influenza B by PCR NEGATIVE NEGATIVE Final    Comment: (NOTE) The Xpert Xpress SARS-CoV-2/FLU/RSV plus assay is intended as an aid in the diagnosis of influenza from Nasopharyngeal swab specimens and should not be used as a sole basis for treatment. Nasal washings and aspirates are unacceptable for Xpert Xpress SARS-CoV-2/FLU/RSV testing.  Fact Sheet for Patients: BloggerCourse.com  Fact Sheet for Healthcare Providers: SeriousBroker.it  This test is not yet approved or cleared by the [place] FDA and has been authorized for detection and/or diagnosis of SARS-CoV-2 by FDA under an Emergency Use Authorization (EUA). This EUA will remain in effect (meaning this test can be used) for the duration of the COVID-19 declaration under Section  564(b)(1) of the Act, 21 U.S.C. section 360bbb-3(b)(1), unless the authorization is terminated or revoked.     Resp Syncytial Virus by PCR NEGATIVE NEGATIVE Final    Comment: (NOTE) Fact Sheet for Patients: BloggerCourse.com  Fact Sheet for Healthcare Providers: SeriousBroker.it  This test is not yet approved or cleared by the [place] FDA and has been authorized for detection and/or diagnosis of SARS-CoV-2 by FDA under an Emergency Use Authorization (EUA). This EUA will remain in effect (meaning this test can be used) for the duration of the COVID-19 declaration under Section 564(b)(1) of the Act, 21 U.S.C. section 360bbb-3(b)(1), unless the authorization is terminated or revoked.  Performed at [facility], [address] [place], [place] [address]       Radiology Studies: CT Head Wo Contrast  Result Date: 07/03/2022 CLINICAL DATA:  Weakness, nausea and vomiting for several days, polyuria EXAM: CT HEAD WITHOUT CONTRAST TECHNIQUE: Contiguous axial images were obtained from the base of the skull through the vertex without intravenous contrast. RADIATION DOSE REDUCTION: This exam was performed according to the departmental dose-optimization program which includes automated exposure control, adjustment of the mA and/or kV according to patient size and/or use of iterative reconstruction technique. COMPARISON:  04/28/2017 FINDINGS: Brain: No acute infarct or hemorrhage. Lateral ventricles and midline structures are unremarkable. No acute extra-axial fluid collections. No mass effect. Vascular: No hyperdense vessel or unexpected calcification. Skull: Normal. Negative for fracture or focal lesion. Sinuses/Orbits: Mild mucosal thickening within the anterior left ethmoid air cells. The remaining paranasal sinuses are clear. Other: None. IMPRESSION: 1. No acute intracranial process. Electronically Signed   By: Sharlet Salina M.D.   On: 07/03/2022 15:43   DG Chest 1 View  Result Date: 07/03/2022 CLINICAL DATA:  Cough and weakness for several days, polyuria EXAM: CHEST  1 VIEW COMPARISON:  09/27/2004 FINDINGS: Single frontal view of the chest demonstrates an unremarkable cardiac silhouette. No acute airspace disease, effusion, or pneumothorax. No acute bony abnormalities. IMPRESSION: 1. No acute intrathoracic process. Electronically Signed   By: Sharlet Salina M.D.   On: 07/03/2022 15:08      Scheduled Meds:  amLODipine  5 mg Oral Daily   aspirin  81 mg Oral Daily   buPROPion  150 mg Oral Daily   carvedilol  12.5 mg Oral BID WC   enoxaparin (LOVENOX) injection  40 mg Subcutaneous QHS   losartan-hydrochlorothiazide  1 tablet Oral Daily   oseltamivir  30 mg Oral BID   pravastatin  40 mg Oral Daily   Continuous Infusions:  sodium chloride 100 mL/hr  at 07/04/22 1026   cefTRIAXone (ROCEPHIN)  IV 1 g (07/04/22 1033)     LOS: 0 days   Time spent: 25 minutes   Noralee Stain, DO [facility] 07/04/2022, 1:07 PM   Available via Epic secure chat 7am-7pm After these hours, please refer to coverage provider listed on [email or web address]

## 2022-07-05 DIAGNOSIS — J101 Influenza due to other identified influenza virus with other respiratory manifestations: Secondary | ICD-10-CM | POA: Diagnosis not present

## 2022-07-05 NOTE — TOC Initial Note (Signed)
Transition of Care [facility]) - Initial/Assessment Note    Patient Details  Name: Cynthia Jenkins MRN: [id number] Date of Birth: 1940/12/03  [facility]) CM/SW Contact:    Otelia Santee, LCSW Phone Number: 07/05/2022, 2:28 PM  Clinical Narrative:                 Spoke with pt via t/c to room and confirmed plan to return home with home health. Pt shares she has not had home health services in the past and does not have an agency preference. Pt shares she does not have DME at home but, does not want any DME ordered.  HHPT has been arranged with [facility]. HH orders will need to be placed prior to discharge.  TOC will continue to follow for further needs.  Expected Discharge Plan: Home w Home Health Services Barriers to Discharge: Continued Medical Work up   Patient Goals and CMS Choice Patient states their goals for this hospitalization and ongoing recovery are:: To return home CMS Medicare.gov Compare Post Acute Care list provided to:: Patient Choice offered to / list presented to : Patient [place] ownership interest in [facility].provided to::  (NA)    Expected Discharge Plan and Services In-house Referral: NA Discharge Planning Services: NA Post Acute Care Choice: Home Health Living arrangements for the past 2 months: Single Family Home                 DME Arranged: N/A DME Agency: NA       HH Arranged: PT HH Agency: [facility] Date HH Agency Contacted: 07/05/22 Time HH Agency Contacted: 1428 Representative spoke with at [facility] Agency: Jerilynn Som  Prior Living Arrangements/Services Living arrangements for the past 2 months: Single Family Home Lives with:: Spouse Patient language and need for interpreter reviewed:: Yes Do you feel safe going back to the place where you live?: Yes      Need for Family Participation in Patient Care: No (Comment) Care giver support system in place?: No (comment)   Criminal Activity/Legal Involvement Pertinent to  Current Situation/Hospitalization: No - Comment as needed  Activities of Daily Living Home Assistive Devices/Equipment: Eyeglasses, Dentures (specify type) (upper) ADL Screening (condition at time of admission) Patient's cognitive ability adequate to safely complete daily activities?: Yes Is the patient deaf or have difficulty hearing?: No Does the patient have difficulty seeing, even when wearing glasses/contacts?: No Does the patient have difficulty concentrating, remembering, or making decisions?: No Patient able to express need for assistance with ADLs?: Yes Does the patient have difficulty dressing or bathing?: No Independently performs ADLs?: Yes (appropriate for developmental age) Does the patient have difficulty walking or climbing stairs?: No Weakness of Legs: None Weakness of Arms/Hands: None  Permission Sought/Granted   Permission granted to share information with : No              Emotional Assessment Appearance::  (Spoke via t/c to room) Attitude/Demeanor/Rapport: Engaged Affect (typically observed): Accepting Orientation: : Oriented to Self, Oriented to Place, Oriented to  Time, Oriented to Situation Alcohol / Substance Use: Not Applicable Psych Involvement: No (comment)  Admission diagnosis:  Weakness [R53.1] Influenza A [J10.1] Acute respiratory failure with hypoxia (HCC) [J96.01] Patient Active Problem List   Diagnosis Date Noted   Influenza A 07/03/2022   Essential hypertension 07/03/2022   Generalized weakness 07/03/2022   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 [place] [address] Phone: [phone] Fax: [phone]  [facility] - [place], [place] - [address] [place] [address] Phone: [phone] Fax: [phone]  [facility] - [place], [place] - [address] [place] [address] Phone: [phone] Fax: [phone]     Social Determinants of Health (SDOH) Social History: SDOH Screenings   Food Insecurity: No Food Insecurity (07/03/2022)  Housing: Low Risk  (07/03/2022)  Transportation Needs: No Transportation Needs (07/03/2022)  Utilities: Not At Risk (07/03/2022)  Tobacco Use: Medium Risk (07/03/2022)   SDOH Interventions:     Readmission Risk Interventions     No data to display

## 2022-07-05 NOTE — Evaluation (Signed)
Physical Therapy Evaluation Patient Details Name: Cynthia Jenkins MRN: [id number] DOB: [birth year] Today's Date: 07/05/2022  History of Present Illness  Patient is 81 y.o. female presented to the ED for evaluation of generalized weakness. Patient reports 2 days of progressively worsening generalized weakness with dry cough. Patient tested positive for Flu A. PMH significant for HTN, HLD.   Clinical Impression  Cynthia Jenkins is 81 y.o. female admitted with above HPI and diagnosis. Patient is currently limited by functional impairments below (see PT problem list). Patient lives with her husband and is independent at baseline. Currently she requires min guard for transfers and guard/assist with gait due to mild imbalances. Pt SpO2 remained stable at 98% on 2L/min. Patient will benefit from continued skilled PT interventions to address impairments and progress independence with mobility, recommending HHPT. Acute PT will follow and progress as able.        Recommendations for follow up therapy are one component of a multi-disciplinary discharge planning process, led by the attending physician.  Recommendations may be updated based on patient status, additional functional criteria and insurance authorization.  Follow Up Recommendations Home health PT      Assistance Recommended at Discharge Frequent or constant Supervision/Assistance  Patient can return home with the following  A little help with walking and/or transfers;A little help with bathing/dressing/bathroom;Assistance with cooking/housework;Direct supervision/assist for medications management;Assist for transportation;Help with stairs or ramp for entrance    Equipment Recommendations None recommended by PT  Recommendations for Other Services       Functional Status Assessment Patient has had a recent decline in their functional status and demonstrates the ability to make significant improvements in function in a reasonable and predictable  amount of time.     Precautions / Restrictions Precautions Precautions: Fall Restrictions Weight Bearing Restrictions: No      Mobility  Bed Mobility Overal bed mobility: Needs Assistance Bed Mobility: Supine to Sit     Supine to sit: Min assist     General bed mobility comments: cues for pt to use bed rail to pivot trunk upright and min assist with bed pad to scoot hips to EOB. pt able to press up trunk.    Transfers Overall transfer level: Needs assistance Equipment used: None Transfers: Sit to/from Stand Sit to Stand: Min guard           General transfer comment: guarding for safety. pt powered up wtih bil UE, no device needed to steady balance in standing.    Ambulation/Gait Ambulation/Gait assistance: Min guard, Min assist Gait Distance (Feet): 160 Feet Assistive device: None Gait Pattern/deviations: Step-through pattern, Decreased stride length, Shuffle, Wide base of support, Drifts right/left Gait velocity: decr     General Gait Details: min guard with intermittent assist to steady balance as pt has tendency to sway Rt/Lt with mild LOB to Lt 1x. SpO2 98% on 2L during gait.  Stairs            Wheelchair Mobility    Modified Rankin (Stroke Patients Only)       Balance Overall balance assessment: Needs assistance Sitting-balance support: Feet supported Sitting balance-Leahy Scale: Good     Standing balance support: No upper extremity supported, During functional activity Standing balance-Leahy Scale: Fair                               Pertinent Vitals/Pain      Home Living Family/patient expects to be discharged to::  Private residence Living Arrangements: Spouse/significant other Available Help at Discharge: Family Type of Home: House Home Access: Stairs to enter Entrance Stairs-Rails: Can reach both Entrance Stairs-Number of Steps: 3   Home Layout: Two level;Able to live on main level with bedroom/bathroom Home  Equipment: None      Prior Function                       Hand Dominance        Extremity/Trunk Assessment   Upper Extremity Assessment Upper Extremity Assessment: Overall WFL for tasks assessed    Lower Extremity Assessment Lower Extremity Assessment: Overall WFL for tasks assessed    Cervical / Trunk Assessment Cervical / Trunk Assessment: Normal  Communication   Communication: No difficulties  Cognition Arousal/Alertness: Awake/alert Behavior During Therapy: WFL for tasks assessed/performed Overall Cognitive Status: No family/caregiver present to determine baseline cognitive functioning                                 General Comments: pt appears oriented x 4, seems to have trouble focusing on task at hand. this may be baseline.        General Comments General comments (skin integrity, edema, etc.): pt c/o Lt arm pain and reports IV removed, no dressing in place covering and pt reports someone took it off and changed the sheets as it was bleeding. Applied gazue dressign to site where serousanguionous drainage noted on forearm. Also are is puffy/swollen appears as if it had been indurated.    Exercises     Assessment/Plan    PT Assessment Patient needs continued PT services  PT Problem List Decreased strength;Decreased range of motion;Decreased activity tolerance;Decreased balance;Decreased mobility;Decreased knowledge of use of DME;Decreased safety awareness;Decreased knowledge of precautions       PT Treatment Interventions DME instruction;Gait training;Stair training;Functional mobility training;Therapeutic activities;Therapeutic exercise;Balance training;Patient/family education    PT Goals (Current goals can be found in the Care Plan section)  Acute Rehab PT Goals Patient Stated Goal: get better and go home PT Goal Formulation: With patient Time For Goal Achievement: 07/19/22 Potential to Achieve Goals: Good    Frequency Min  3X/week     Co-evaluation               AM-PAC PT "6 Clicks" Mobility  Outcome Measure Help needed turning from your back to your side while in a flat bed without using bedrails?: A Little Help needed moving from lying on your back to sitting on the side of a flat bed without using bedrails?: A Little Help needed moving to and from a bed to a chair (including a wheelchair)?: A Little Help needed standing up from a chair using your arms (e.g., wheelchair or bedside chair)?: A Little Help needed to walk in hospital room?: A Little Help needed climbing 3-5 steps with a railing? : A Lot 6 Click Score: 17    End of Session Equipment Utilized During Treatment: Gait belt Activity Tolerance: Patient tolerated treatment well Patient left: with call bell/phone within reach;in chair;with chair alarm set Nurse Communication: Mobility status PT Visit Diagnosis: Muscle weakness (generalized) (M62.81);Difficulty in walking, not elsewhere classified (R26.2);Unsteadiness on feet (R26.81)    Time: 8099-8338 PT Time Calculation (min) (ACUTE ONLY): 32 min   Charges:   PT Evaluation $PT Eval Moderate Complexity: 1 Mod PT Treatments $Gait Training: 8-22 mins        Renaldo Fiddler. PT,  DPT Acute Rehabilitation Services Office [phone]  07/05/22 10:55 AM

## 2022-07-05 NOTE — Progress Notes (Signed)
PROGRESS NOTE    Cynthia Jenkins  MMH:[id number] DOB: Jul 03, 1941 DOA: 07/03/2022 PCP: Chilton Greathouse, MD    Brief Narrative:  81 year old with history of hypertension hyperlipidemia presented with about 2 days of progressive weakness and dry cough.  Unable to walk.  She was found to have influenza A infection and abnormal urine analysis.   Assessment & Plan:   Influenza A infection with profound generalized weakness and debility: Agree with admission given severity of symptoms. Tamiflu, completed 5 days of therapy. Chest physiotherapy, incentive spirometry, deep breathing exercises, sputum induction, mucolytic's and bronchodilators. Supplemental oxygen to keep saturations more than 90%. Continue mobilizing with PT OT.  Acute UTI: Present on admission.  Urine culture growing more than 90,000 colonies of gram-negative rods.  Remains on Rocephin.  Continue to follow cultures.  Chronic medical issues including Essential hypertension, stable on Norvasc, losartan and hydrochlorothiazide Hyperlipidemia, stable on Pravachol.  Continue mobilizing with PT OT to improve strength and endurance before discharging home.   DVT prophylaxis: enoxaparin (LOVENOX) injection 40 mg Start: 07/03/22 2200   Code Status: Full code Family Communication: None at the bedside Disposition Plan: Status is: Observation The patient will require care spanning > 2 midnights and should be moved to inpatient because: IV antibiotics, profound debility.     Consultants:  None  Procedures:  None  Antimicrobials:  Tamiflu 12/27-- Rocephin 12/27--   Subjective: Patient seen in the morning rounds.  She has some dry cough.  Afebrile overnight.  Feels her breathing is better, however feels extremely weak and has no strength to mobilize around.  Denies any nausea or vomiting.  Appetite is improving.  Objective: Vitals:   07/04/22 1403 07/04/22 2135 07/05/22 0543 07/05/22 1302  BP: 123/60 115/61 (!) 122/54  (!) 101/58  Pulse: (!) 59 73 77 (!) 45  Resp: 18 20 18    Temp: 98.9 F (37.2 C)  99 F (37.2 C)   TempSrc: Oral  Oral   SpO2: 96% 95% 94% 97%  Weight:      Height:        Intake/Output Summary (Last 24 hours) at 07/05/2022 1347 Last data filed at 07/05/2022 1594 Gross per 24 hour  Intake 2495.65 ml  Output 1500 ml  Net 995.65 ml   Filed Weights   07/03/22 1424  Weight: 75 kg    Examination:  General exam: Appears calm and comfortable.  On 2 L oxygen. Respiratory system: Clear to auscultation. Respiratory effort normal.  Occasional upper airway sounds. Cardiovascular system: S1 & S2 heard, RRR. No pedal edema. Gastrointestinal system: Abdomen is nondistended, soft and nontender. No organomegaly or masses felt. Normal bowel sounds heard. Central nervous system: Alert and oriented. No focal neurological deficits. Extremities: Symmetric 5 x 5 power. Skin: No rashes, lesions or ulcers Psychiatry: Judgement and insight appear normal. Mood & affect appropriate.     Data Reviewed: I have personally reviewed following labs and imaging studies  CBC: Recent Labs  Lab 07/03/22 1448 07/03/22 1454 07/04/22 0529  WBC 8.4  --  6.5  NEUTROABS 6.2  --   --   HGB 15.2* 15.0 13.3  HCT 46.3* 44.0 41.9  MCV 95.1  --  98.8  PLT 192  --  149*   Basic Metabolic Panel: Recent Labs  Lab 07/03/22 1448 07/03/22 1454 07/04/22 0529  NA 139 139 140  K 3.6 3.5 3.9  CL 106 103 106  CO2 25  --  28  GLUCOSE 114* 110* 94  BUN 14 12 12  CREATININE 0.94 0.80 0.99  CALCIUM 8.7*  --  8.0*   GFR: Estimated Creatinine Clearance: 46.3 mL/min (by C-G formula based on SCr of 0.99 mg/dL). Liver Function Tests: Recent Labs  Lab 07/03/22 1448  AST 24  ALT 23  ALKPHOS 40  BILITOT 0.8  PROT 6.5  ALBUMIN 3.3*   Recent Labs  Lab 07/03/22 1448  LIPASE 29   No results for input(s): "AMMONIA" in the last 168 hours. Coagulation Profile: No results for input(s): "INR", "PROTIME" in the  last 168 hours. Cardiac Enzymes: No results for input(s): "CKTOTAL", "CKMB", "CKMBINDEX", "TROPONINI" in the last 168 hours. BNP (last 3 results) No results for input(s): "PROBNP" in the last 8760 hours. HbA1C: No results for input(s): "HGBA1C" in the last 72 hours. CBG: Recent Labs  Lab 07/03/22 1446  GLUCAP 109*   Lipid Profile: No results for input(s): "CHOL", "HDL", "LDLCALC", "TRIG", "CHOLHDL", "LDLDIRECT" in the last 72 hours. Thyroid Function Tests: No results for input(s): "TSH", "T4TOTAL", "FREET4", "T3FREE", "THYROIDAB" in the last 72 hours. Anemia Panel: No results for input(s): "VITAMINB12", "FOLATE", "FERRITIN", "TIBC", "IRON", "RETICCTPCT" in the last 72 hours. Sepsis Labs: No results for input(s): "PROCALCITON", "LATICACIDVEN" in the last 168 hours.  Recent Results (from the past 240 hour(s))  Resp panel by RT-PCR (RSV, Flu A&B, Covid) Anterior Nasal Swab     Status: Abnormal   Collection Time: 07/03/22  2:49 PM   Specimen: Anterior Nasal Swab  Result Value Ref Range Status   SARS Coronavirus 2 by RT PCR NEGATIVE NEGATIVE Final    Comment: (NOTE) SARS-CoV-2 target nucleic acids are NOT DETECTED.  The SARS-CoV-2 RNA is generally detectable in upper respiratory specimens during the acute phase of infection. The lowest concentration of SARS-CoV-2 viral copies this assay can detect is 138 copies/mL. A negative result does not preclude SARS-Cov-2 infection and should not be used as the sole basis for treatment or other patient management decisions. A negative result may occur with  improper specimen collection/handling, submission of specimen other than nasopharyngeal swab, presence of viral mutation(s) within the areas targeted by this assay, and inadequate number of viral copies(<138 copies/mL). A negative result must be combined with clinical observations, patient history, and epidemiological information. The expected result is Negative.  Fact Sheet for  Patients:  BloggerCourse.com  Fact Sheet for Healthcare Providers:  SeriousBroker.it  This test is no t yet approved or cleared by the [place] FDA and  has been authorized for detection and/or diagnosis of SARS-CoV-2 by FDA under an Emergency Use Authorization (EUA). This EUA will remain  in effect (meaning this test can be used) for the duration of the COVID-19 declaration under Section 564(b)(1) of the Act, 21 U.S.C.section 360bbb-3(b)(1), unless the authorization is terminated  or revoked sooner.       Influenza A by PCR POSITIVE (A) NEGATIVE Final   Influenza B by PCR NEGATIVE NEGATIVE Final    Comment: (NOTE) The Xpert Xpress SARS-CoV-2/FLU/RSV plus assay is intended as an aid in the diagnosis of influenza from Nasopharyngeal swab specimens and should not be used as a sole basis for treatment. Nasal washings and aspirates are unacceptable for Xpert Xpress SARS-CoV-2/FLU/RSV testing.  Fact Sheet for Patients: BloggerCourse.com  Fact Sheet for Healthcare Providers: SeriousBroker.it  This test is not yet approved or cleared by the [place] FDA and has been authorized for detection and/or diagnosis of SARS-CoV-2 by FDA under an Emergency Use Authorization (EUA). This EUA will remain in effect (meaning this test can be  used) for the duration of the COVID-19 declaration under Section 564(b)(1) of the Act, 21 U.S.C. section 360bbb-3(b)(1), unless the authorization is terminated or revoked.     Resp Syncytial Virus by PCR NEGATIVE NEGATIVE Final    Comment: (NOTE) Fact Sheet for Patients: EntrepreneurPulse.com.au  Fact Sheet for Healthcare Providers: IncredibleEmployment.be  This test is not yet approved or cleared by the [place] FDA and has been authorized for detection and/or diagnosis of SARS-CoV-2 by FDA under an  Emergency Use Authorization (EUA). This EUA will remain in effect (meaning this test can be used) for the duration of the COVID-19 declaration under Section 564(b)(1) of the Act, 21 U.S.C. section 360bbb-3(b)(1), unless the authorization is terminated or revoked.  Performed at [facility], [place] [address] [place], [place] [address]   Urine Culture     Status: Abnormal (Preliminary result)   Collection Time: 07/03/22  5:42 PM   Specimen: Urine, Clean Catch  Result Value Ref Range Status   Specimen Description   Final    URINE, CLEAN CATCH Performed at [facility], [place] [address], [place], [place] [address]    Special Requests   Final    NONE Performed at [facility], [place] [address], [place], [place] [address]    Culture (A)  Final    90,000 COLONIES/mL Lonell Grandchild NEGATIVE RODS IDENTIFICATION AND SUSCEPTIBILITIES TO FOLLOW Performed at [facility], [place] [address] [place], [place] [address]    Report Status PENDING  Incomplete         Radiology Studies: CT Head Wo Contrast  Result Date: 07/03/2022 CLINICAL DATA:  Weakness, nausea and vomiting for several days, polyuria EXAM: CT HEAD WITHOUT CONTRAST TECHNIQUE: Contiguous axial images were obtained from the base of the skull through the vertex without intravenous contrast. RADIATION DOSE REDUCTION: This exam was performed according to the departmental dose-optimization program which includes automated exposure control, adjustment of the mA and/or kV according to patient size and/or use of iterative reconstruction technique. COMPARISON:  04/28/2017 FINDINGS: Brain: No acute infarct or hemorrhage. Lateral ventricles and midline structures are unremarkable. No acute extra-axial fluid collections. No mass effect. Vascular: No hyperdense vessel or unexpected calcification. Skull: Normal. Negative for fracture or focal lesion. Sinuses/Orbits: Mild mucosal thickening within the  anterior left ethmoid air cells. The remaining paranasal sinuses are clear. Other: None. IMPRESSION: 1. No acute intracranial process. Electronically Signed   By: Randa Ngo M.D.   On: 07/03/2022 15:43   DG Chest 1 View  Result Date: 07/03/2022 CLINICAL DATA:  Cough and weakness for several days, polyuria EXAM: CHEST  1 VIEW COMPARISON:  09/27/2004 FINDINGS: Single frontal view of the chest demonstrates an unremarkable cardiac silhouette. No acute airspace disease, effusion, or pneumothorax. No acute bony abnormalities. IMPRESSION: 1. No acute intrathoracic process. Electronically Signed   By: Randa Ngo M.D.   On: 07/03/2022 15:08        Scheduled Meds:  amLODipine  5 mg Oral Daily   aspirin  81 mg Oral Daily   buPROPion  150 mg Oral Daily   carvedilol  12.5 mg Oral BID WC   enoxaparin (LOVENOX) injection  40 mg Subcutaneous QHS   losartan  100 mg Oral Daily   And   hydrochlorothiazide  12.5 mg Oral Daily   oseltamivir  30 mg Oral BID   pravastatin  40 mg Oral q1800   Continuous Infusions:  sodium chloride 100 mL/hr at 07/05/22 0400   cefTRIAXone (ROCEPHIN)  IV 1 g (07/05/22 1120)     LOS: 0 days    Time spent: 35 minutes    Barb Merino, MD [facility] Pager [phone]

## 2022-07-06 DIAGNOSIS — J101 Influenza due to other identified influenza virus with other respiratory manifestations: Secondary | ICD-10-CM | POA: Diagnosis not present

## 2022-07-06 LAB — URINE CULTURE: Culture: 90000 — AB

## 2022-07-06 MED ORDER — OSELTAMIVIR PHOSPHATE 30 MG PO CAPS: 30.0000 mg | ORAL_CAPSULE | Freq: Two times a day (BID) | ORAL | 0 refills | Status: AC

## 2022-07-06 MED ORDER — CEFDINIR 300 MG PO CAPS: 300.0000 mg | ORAL_CAPSULE | Freq: Two times a day (BID) | ORAL | 0 refills | Status: AC

## 2022-07-06 MED ORDER — ALBUTEROL SULFATE HFA 108 (90 BASE) MCG/ACT IN AERS: 2.0000 | INHALATION_SPRAY | Freq: Four times a day (QID) | RESPIRATORY_TRACT | 2 refills | Status: AC | PRN

## 2022-07-06 MED ORDER — GUAIFENESIN-DM 100-10 MG/5ML PO SYRP: 5.0000 mL | ORAL_SOLUTION | ORAL | 0 refills | Status: DC | PRN

## 2022-07-06 NOTE — Progress Notes (Signed)
Up to BR and get a shower. Maintained RA sats at 93%. Dr Rennis Petty aware.

## 2022-07-06 NOTE — Plan of Care (Signed)
  Problem: Education: Goal: Knowledge of General Education information will improve Description: Including pain rating scale, medication(s)/side effects and non-pharmacologic comfort measures Outcome: Adequate for Discharge   Problem: Health Behavior/Discharge Planning: Goal: Ability to manage health-related needs will improve Outcome: Adequate for Discharge   Problem: Clinical Measurements: Goal: Ability to maintain clinical measurements within normal limits will improve Outcome: Adequate for Discharge Goal: Will remain free from infection Outcome: Adequate for Discharge Goal: Diagnostic test results will improve Outcome: Adequate for Discharge Goal: Respiratory complications will improve Outcome: Adequate for Discharge Goal: Cardiovascular complication will be avoided Outcome: Adequate for Discharge   Problem: Activity: Goal: Risk for activity intolerance will decrease Outcome: Adequate for Discharge   Problem: Nutrition: Goal: Adequate nutrition will be maintained Outcome: Adequate for Discharge   Problem: Coping: Goal: Level of anxiety will decrease Outcome: Adequate for Discharge   Problem: Elimination: Goal: Will not experience complications related to bowel motility Outcome: Adequate for Discharge Goal: Will not experience complications related to urinary retention Outcome: Adequate for Discharge   Problem: Pain Managment: Goal: General experience of comfort will improve Outcome: Adequate for Discharge   Problem: Safety: Goal: Ability to remain free from injury will improve Outcome: Adequate for Discharge   Problem: Skin Integrity: Goal: Risk for impaired skin integrity will decrease Outcome: Adequate for Discharge   Problem: Acute Rehab PT Goals(only PT should resolve) Goal: Patient Will Transfer Sit To/From Stand Outcome: Adequate for Discharge Goal: Pt Will Ambulate Outcome: Adequate for Discharge Goal: Pt/caregiver will Perform Home Exercise  Program Outcome: Adequate for Discharge   

## 2022-07-06 NOTE — TOC Transition Note (Signed)
Transition of Care [facility]) - CM/SW Discharge Note  Patient Details  Name: Cynthia Jenkins MRN: [id number] Date of Birth: 1941/01/20  Transition of Care [facility]) CM/SW Contact:  Ewing Schlein, LCSW Phone Number: 07/06/2022, 1:24 PM  Clinical Narrative: Per hospitalist, patient is now declining HH. TOC signing off.  Final next level of care: Home/Self Care Barriers to Discharge: Barriers Resolved  Patient Goals and CMS Choice CMS Medicare.gov Compare Post Acute Care list provided to:: Patient Choice offered to / list presented to : Patient  Discharge Plan and Services Additional resources added to the After Visit Summary for   In-house Referral: NA Discharge Planning Services: NA Post Acute Care Choice: Home Health          DME Arranged: N/A DME Agency: NA HH Arranged: PT HH Agency: [facility] Date HH Agency Contacted: 07/05/22 Time HH Agency Contacted: 1428 Representative spoke with at [facility] Agency: Jerilynn Som  Social Determinants of Health (SDOH) Interventions SDOH Screenings   Food Insecurity: No Food Insecurity (07/03/2022)  Housing: Low Risk  (07/03/2022)  Transportation Needs: No Transportation Needs (07/03/2022)  Utilities: Not At Risk (07/03/2022)  Tobacco Use: Medium Risk (07/03/2022)   Readmission Risk Interventions     No data to display

## 2022-07-06 NOTE — Plan of Care (Signed)

## 2022-07-06 NOTE — Progress Notes (Signed)
Assessment unchanged. Pt and husband verbalized understanding of dc instructions including medications and follow up care. Discharged via wc to front entrance by NT.

## 2022-07-06 NOTE — Discharge Summary (Signed)
Physician Discharge Summary  Cynthia Jenkins [id number] DOB: 1940/11/22 DOA: 07/03/2022  PCP: Prince Solian, MD  Admit date: 07/03/2022 Discharge date: 07/06/2022  Admitted From: Home Disposition: Home  Recommendations for Outpatient Follow-up:  Follow up with PCP in 1-2 weeks   Home Health: PT recommended.  Patient declined Equipment/Devices: None  Discharge Condition: Stable CODE STATUS: Full code Diet recommendation: Low-salt diet  Discharge summary: 81 year old with history of hypertension , hyperlipidemia presented with about 2 days of progressive weakness and dry cough.  Unable to walk.  She was found to have influenza A infection and abnormal urine analysis.  She was admitted due to profound debility and unable to discharge home.  She was treated and ultimately did good clinical recovery.  Influenza A infection with profound generalized weakness and debility: Good clinical recovery today. Tamiflu, complete 5 days of therapy. Chest physiotherapy, incentive spirometry, deep breathing exercises, sputum induction, mucolytic's and bronchodilators at home. Able to wean off to room air. Mobilized with therapy.  Recommended home health PT, however she wants to work with her granddaughter by herself.   Acute UTI: Present on admission.  Urine culture with 90,000 Klebsiella.  Rocephin day 3 today.  Will continue Omnicef for 4 additional days.    Chronic medical issues including Essential hypertension, stable on Norvasc, losartan and hydrochlorothiazide Hyperlipidemia, stable on Pravachol.   Mobilized today.  Did fairly well.  Able to manage symptoms at home.    Discharge Diagnoses:  Principal Problem:   Influenza A Active Problems:   Generalized weakness   Hypercholesterolemia   Essential hypertension    Discharge Instructions  Discharge Instructions     Diet - low sodium heart healthy   Complete by: As directed    Increase activity slowly   Complete by: As  directed       Allergies as of 07/06/2022       Reactions   Aceon [perindopril Erbumine]    cough   Codeine    unknown   Crestor [rosuvastatin Calcium]    aching   Diovan [valsartan]    Dizzy,rash   Iodine Other (See Comments)   Tongue swells per patient   Norvasc [amlodipine Besylate]    unknown   Zetia [ezetimibe]    cramps        Medication List     TAKE these medications    albuterol 108 (90 Base) MCG/ACT inhaler Commonly known as: VENTOLIN HFA Inhale 2 puffs into the lungs every 6 (six) hours as needed for wheezing or shortness of breath.   amLODipine 5 MG tablet Commonly known as: NORVASC Take 5 mg by mouth daily.   aspirin 81 MG tablet Take 81 mg by mouth daily.   buPROPion 150 MG 24 hr tablet Commonly known as: WELLBUTRIN XL Take 150 mg by mouth daily.   carvedilol 12.5 MG tablet Commonly known as: COREG Take 12.5 mg by mouth 2 (two) times daily with a meal.   cefdinir 300 MG capsule Commonly known as: OMNICEF Take 1 capsule (300 mg total) by mouth 2 (two) times daily for 4 days.   FISH OIL PO Take 1 capsule by mouth daily.   fluticasone 50 MCG/ACT nasal spray Commonly known as: FLONASE Place 1 spray into both nostrils daily as needed for allergies.   guaiFENesin-dextromethorphan 100-10 MG/5ML syrup Commonly known as: ROBITUSSIN DM Take 5 mLs by mouth every 4 (four) hours as needed for cough.   loratadine 10 MG tablet Commonly known as: CLARITIN Take 10 mg by  mouth daily as needed for allergies.   losartan-hydrochlorothiazide 100-12.5 MG tablet Commonly known as: HYZAAR Take 1 tablet by mouth daily.   omeprazole 40 MG capsule Commonly known as: PRILOSEC Take 40 mg by mouth daily.   ondansetron 8 MG disintegrating tablet Commonly known as: ZOFRAN-ODT Take 8 mg by mouth every 8 (eight) hours as needed for vomiting or nausea.   oseltamivir 30 MG capsule Commonly known as: TAMIFLU Take 1 capsule (30 mg total) by mouth 2 (two)  times daily for 2 days.   pravastatin 40 MG tablet Commonly known as: PRAVACHOL Take 1 tablet (40 mg total) by mouth daily.   traMADol 50 MG tablet Commonly known as: ULTRAM Take 1 tablet (50 mg total) by mouth every 6 (six) hours as needed. What changed: reasons to take this   VITAMIN B 12 PO Take 1 tablet by mouth daily.   VITAMIN D PO Take 1 tablet by mouth daily.        [facility].   Specialty: Home Health Services Why: Jackquline [facility] will contact you at discharge to provide home health physical therapy. Contact information: [place] [address] [phone]                Allergies  Allergen Reactions   Aceon [Perindopril Erbumine]     cough   Codeine     unknown   Crestor [Rosuvastatin Calcium]     aching   Diovan [Valsartan]     Dizzy,rash   Iodine Other (See Comments)    Tongue swells per patient   Norvasc [Amlodipine Besylate]     unknown   Zetia [Ezetimibe]     cramps    Consultations: None   Procedures/Studies: CT Head Wo Contrast  Result Date: 07/03/2022 CLINICAL DATA:  Weakness, nausea and vomiting for several days, polyuria EXAM: CT HEAD WITHOUT CONTRAST TECHNIQUE: Contiguous axial images were obtained from the base of the skull through the vertex without intravenous contrast. RADIATION DOSE REDUCTION: This exam was performed according to the departmental dose-optimization program which includes automated exposure control, adjustment of the mA and/or kV according to patient size and/or use of iterative reconstruction technique. COMPARISON:  04/28/2017 FINDINGS: Brain: No acute infarct or hemorrhage. Lateral ventricles and midline structures are unremarkable. No acute extra-axial fluid collections. No mass effect. Vascular: No hyperdense vessel or unexpected calcification. Skull: Normal. Negative for fracture or focal lesion. Sinuses/Orbits: Mild mucosal  thickening within the anterior left ethmoid air cells. The remaining paranasal sinuses are clear. Other: None. IMPRESSION: 1. No acute intracranial process. Electronically Signed   By: Randa Ngo M.D.   On: 07/03/2022 15:43   DG Chest 1 View  Result Date: 07/03/2022 CLINICAL DATA:  Cough and weakness for several days, polyuria EXAM: CHEST  1 VIEW COMPARISON:  09/27/2004 FINDINGS: Single frontal view of the chest demonstrates an unremarkable cardiac silhouette. No acute airspace disease, effusion, or pneumothorax. No acute bony abnormalities. IMPRESSION: 1. No acute intrathoracic process. Electronically Signed   By: Randa Ngo M.D.   On: 07/03/2022 15:08   (Echo, Carotid, EGD, Colonoscopy, ERCP)    Subjective: Patient seen and examined.  No overnight events.  Still has occasional cough and wheezing but able to come off the oxygen.  She was able to walk today and she thinks she can manage her symptoms at home.  Afebrile.   Discharge Exam: Vitals:   07/05/22 2154 07/06/22 [id number]  BP: 127/65 126/65  Pulse: 77 66  Resp: 16 17  Temp: 98.2 F (36.8 C) 97.7 F (36.5 C)  SpO2: 96% 96%   Vitals:   07/05/22 1414 07/05/22 1415 07/05/22 2154 07/06/22 0526  BP: (!) 106/53 (!) 89/51 127/65 126/65  Pulse: 68 83 77 66  Resp:   16 17  Temp: 97.9 F (36.6 C)  98.2 F (36.8 C) 97.7 F (36.5 C)  TempSrc: Oral  Oral Oral  SpO2: 95% 96% 96% 96%  Weight:      Height:        General: Pt is alert, awake, not in acute distress Cardiovascular: RRR, S1/S2 +, no rubs, no gallops Respiratory: CTA bilaterally, no wheezing, no rhonchi, occasional conducted upper airway sounds. Abdominal: Soft, NT, ND, bowel sounds + Extremities: no edema, no cyanosis    The results of significant diagnostics from this hospitalization (including imaging, microbiology, ancillary and laboratory) are listed below for reference.     Microbiology: Recent Results (from the past 240 hour(s))  Resp panel by RT-PCR  (RSV, Flu A&B, Covid) Anterior Nasal Swab     Status: Abnormal   Collection Time: 07/03/22  2:49 PM   Specimen: Anterior Nasal Swab  Result Value Ref Range Status   SARS Coronavirus 2 by RT PCR NEGATIVE NEGATIVE Final    Comment: (NOTE) SARS-CoV-2 target nucleic acids are NOT DETECTED.  The SARS-CoV-2 RNA is generally detectable in upper respiratory specimens during the acute phase of infection. The lowest concentration of SARS-CoV-2 viral copies this assay can detect is 138 copies/mL. A negative result does not preclude SARS-Cov-2 infection and should not be used as the sole basis for treatment or other patient management decisions. A negative result may occur with  improper specimen collection/handling, submission of specimen other than nasopharyngeal swab, presence of viral mutation(s) within the areas targeted by this assay, and inadequate number of viral copies(<138 copies/mL). A negative result must be combined with clinical observations, patient history, and epidemiological information. The expected result is Negative.  Fact Sheet for Patients:  EntrepreneurPulse.com.au  Fact Sheet for Healthcare Providers:  IncredibleEmployment.be  This test is no t yet approved or cleared by the [place] FDA and  has been authorized for detection and/or diagnosis of SARS-CoV-2 by FDA under an Emergency Use Authorization (EUA). This EUA will remain  in effect (meaning this test can be used) for the duration of the COVID-19 declaration under Section 564(b)(1) of the Act, 21 U.S.C.section 360bbb-3(b)(1), unless the authorization is terminated  or revoked sooner.       Influenza A by PCR POSITIVE (A) NEGATIVE Final   Influenza B by PCR NEGATIVE NEGATIVE Final    Comment: (NOTE) The Xpert Xpress SARS-CoV-2/FLU/RSV plus assay is intended as an aid in the diagnosis of influenza from Nasopharyngeal swab specimens and should not be used as a sole basis  for treatment. Nasal washings and aspirates are unacceptable for Xpert Xpress SARS-CoV-2/FLU/RSV testing.  Fact Sheet for Patients: EntrepreneurPulse.com.au  Fact Sheet for Healthcare Providers: IncredibleEmployment.be  This test is not yet approved or cleared by the [place] FDA and has been authorized for detection and/or diagnosis of SARS-CoV-2 by FDA under an Emergency Use Authorization (EUA). This EUA will remain in effect (meaning this test can be used) for the duration of the COVID-19 declaration under Section 564(b)(1) of the Act, 21 U.S.C. section 360bbb-3(b)(1), unless the authorization is terminated or revoked.     Resp Syncytial Virus by PCR NEGATIVE NEGATIVE Final  Comment: (NOTE) Fact Sheet for Patients: BloggerCourse.com  Fact Sheet for Healthcare Providers: SeriousBroker.it  This test is not yet approved or cleared by the [place] FDA and has been authorized for detection and/or diagnosis of SARS-CoV-2 by FDA under an Emergency Use Authorization (EUA). This EUA will remain in effect (meaning this test can be used) for the duration of the COVID-19 declaration under Section 564(b)(1) of the Act, 21 U.S.C. section 360bbb-3(b)(1), unless the authorization is terminated or revoked.  Performed at [facility], [address], [place], [place] [address]   Urine Culture     Status: Abnormal   Collection Time: 07/03/22  5:42 PM   Specimen: Urine, Clean Catch  Result Value Ref Range Status   Specimen Description   Final    URINE, CLEAN CATCH Performed at [facility], [address] [place], [place] [address]    Special Requests   Final    NONE Performed at [facility], [address], [place], [place] [address]    Culture 90,000 COLONIES/mL KLEBSIELLA PNEUMONIAE (A)  Final   Report Status 07/06/2022 FINAL   Final   Organism ID, Bacteria KLEBSIELLA PNEUMONIAE (A)  Final      Susceptibility   Klebsiella pneumoniae - MIC*    AMPICILLIN RESISTANT Resistant     CEFAZOLIN <=4 SENSITIVE Sensitive     CEFEPIME <=0.12 SENSITIVE Sensitive     CEFTRIAXONE <=0.25 SENSITIVE Sensitive     CIPROFLOXACIN <=0.25 SENSITIVE Sensitive     GENTAMICIN <=1 SENSITIVE Sensitive     IMIPENEM <=0.25 SENSITIVE Sensitive     NITROFURANTOIN 32 SENSITIVE Sensitive     TRIMETH/SULFA <=20 SENSITIVE Sensitive     AMPICILLIN/SULBACTAM 4 SENSITIVE Sensitive     PIP/TAZO <=4 SENSITIVE Sensitive     * 90,000 COLONIES/mL KLEBSIELLA PNEUMONIAE     Labs: BNP (last 3 results) No results for input(s): "BNP" in the last 8760 hours. Basic Metabolic Panel: Recent Labs  Lab 07/03/22 1448 07/03/22 1454 07/04/22 0529  NA 139 139 140  K 3.6 3.5 3.9  CL 106 103 106  CO2 25  --  28  GLUCOSE 114* 110* 94  BUN 14 12 12   CREATININE 0.94 0.80 0.99  CALCIUM 8.7*  --  8.0*   Liver Function Tests: Recent Labs  Lab 07/03/22 1448  AST 24  ALT 23  ALKPHOS 40  BILITOT 0.8  PROT 6.5  ALBUMIN 3.3*   Recent Labs  Lab 07/03/22 1448  LIPASE 29   No results for input(s): "AMMONIA" in the last 168 hours. CBC: Recent Labs  Lab 07/03/22 1448 07/03/22 1454 07/04/22 0529  WBC 8.4  --  6.5  NEUTROABS 6.2  --   --   HGB 15.2* 15.0 13.3  HCT 46.3* 44.0 41.9  MCV 95.1  --  98.8  PLT 192  --  149*   Cardiac Enzymes: No results for input(s): "CKTOTAL", "CKMB", "CKMBINDEX", "TROPONINI" in the last 168 hours. BNP: Invalid input(s): "POCBNP" CBG: Recent Labs  Lab 07/03/22 1446  GLUCAP 109*   D-Dimer No results for input(s): "DDIMER" in the last 72 hours. Hgb A1c No results for input(s): "HGBA1C" in the last 72 hours. Lipid Profile No results for input(s): "CHOL", "HDL", "LDLCALC", "TRIG", "CHOLHDL", "LDLDIRECT" in the last 72 hours. Thyroid function studies No results for input(s): "TSH", "T4TOTAL", "T3FREE",  "THYROIDAB" in the last 72 hours.  Invalid input(s): "FREET3" Anemia work up No results for input(s): "VITAMINB12", "FOLATE", "FERRITIN", "TIBC", "IRON", "RETICCTPCT"  in the last 72 hours. Urinalysis    Component Value Date/Time   COLORURINE YELLOW 07/03/2022 1742   APPEARANCEUR HAZY (A) 07/03/2022 1742   LABSPEC 1.014 07/03/2022 1742   PHURINE 6.0 07/03/2022 1742   GLUCOSEU NEGATIVE 07/03/2022 1742   HGBUR MODERATE (A) 07/03/2022 1742   BILIRUBINUR NEGATIVE 07/03/2022 1742   KETONESUR 5 (A) 07/03/2022 1742   PROTEINUR NEGATIVE 07/03/2022 1742   NITRITE NEGATIVE 07/03/2022 1742   LEUKOCYTESUR LARGE (A) 07/03/2022 1742   Sepsis Labs Recent Labs  Lab 07/03/22 1448 07/04/22 0529  WBC 8.4 6.5   Microbiology Recent Results (from the past 240 hour(s))  Resp panel by RT-PCR (RSV, Flu A&B, Covid) Anterior Nasal Swab     Status: Abnormal   Collection Time: 07/03/22  2:49 PM   Specimen: Anterior Nasal Swab  Result Value Ref Range Status   SARS Coronavirus 2 by RT PCR NEGATIVE NEGATIVE Final    Comment: (NOTE) SARS-CoV-2 target nucleic acids are NOT DETECTED.  The SARS-CoV-2 RNA is generally detectable in upper respiratory specimens during the acute phase of infection. The lowest concentration of SARS-CoV-2 viral copies this assay can detect is 138 copies/mL. A negative result does not preclude SARS-Cov-2 infection and should not be used as the sole basis for treatment or other patient management decisions. A negative result may occur with  improper specimen collection/handling, submission of specimen other than nasopharyngeal swab, presence of viral mutation(s) within the areas targeted by this assay, and inadequate number of viral copies(<138 copies/mL). A negative result must be combined with clinical observations, patient history, and epidemiological information. The expected result is Negative.  Fact Sheet for Patients:  EntrepreneurPulse.com.au  Fact  Sheet for Healthcare Providers:  IncredibleEmployment.be  This test is no t yet approved or cleared by the [place] FDA and  has been authorized for detection and/or diagnosis of SARS-CoV-2 by FDA under an Emergency Use Authorization (EUA). This EUA will remain  in effect (meaning this test can be used) for the duration of the COVID-19 declaration under Section 564(b)(1) of the Act, 21 U.S.C.section 360bbb-3(b)(1), unless the authorization is terminated  or revoked sooner.       Influenza A by PCR POSITIVE (A) NEGATIVE Final   Influenza B by PCR NEGATIVE NEGATIVE Final    Comment: (NOTE) The Xpert Xpress SARS-CoV-2/FLU/RSV plus assay is intended as an aid in the diagnosis of influenza from Nasopharyngeal swab specimens and should not be used as a sole basis for treatment. Nasal washings and aspirates are unacceptable for Xpert Xpress SARS-CoV-2/FLU/RSV testing.  Fact Sheet for Patients: EntrepreneurPulse.com.au  Fact Sheet for Healthcare Providers: IncredibleEmployment.be  This test is not yet approved or cleared by the [place] FDA and has been authorized for detection and/or diagnosis of SARS-CoV-2 by FDA under an Emergency Use Authorization (EUA). This EUA will remain in effect (meaning this test can be used) for the duration of the COVID-19 declaration under Section 564(b)(1) of the Act, 21 U.S.C. section 360bbb-3(b)(1), unless the authorization is terminated or revoked.     Resp Syncytial Virus by PCR NEGATIVE NEGATIVE Final    Comment: (NOTE) Fact Sheet for Patients: EntrepreneurPulse.com.au  Fact Sheet for Healthcare Providers: IncredibleEmployment.be  This test is not yet approved or cleared by the [place] FDA and has been authorized for detection and/or diagnosis of SARS-CoV-2 by FDA under an Emergency Use Authorization (EUA). This EUA will remain in effect  (meaning this test can be used) for the duration of the COVID-19 declaration under Section  564(b)(1) of the Act, 21 U.S.C. section 360bbb-3(b)(1), unless the authorization is terminated or revoked.  Performed at [facility], [place] [address] [place], [place] [address]   Urine Culture     Status: Abnormal   Collection Time: 07/03/22  5:42 PM   Specimen: Urine, Clean Catch  Result Value Ref Range Status   Specimen Description   Final    URINE, CLEAN CATCH Performed at [facility], [place] [address], [place],  [address]    Special Requests   Final    NONE Performed at [facility], [place] [address] [place],  [address]    Culture 90,000 COLONIES/mL KLEBSIELLA PNEUMONIAE (A)  Final   Report Status 07/06/2022 FINAL  Final   Organism ID, Bacteria KLEBSIELLA PNEUMONIAE (A)  Final      Susceptibility   Klebsiella pneumoniae - MIC*    AMPICILLIN RESISTANT Resistant     CEFAZOLIN <=4 SENSITIVE Sensitive     CEFEPIME <=0.12 SENSITIVE Sensitive     CEFTRIAXONE <=0.25 SENSITIVE Sensitive     CIPROFLOXACIN <=0.25 SENSITIVE Sensitive     GENTAMICIN <=1 SENSITIVE Sensitive     IMIPENEM <=0.25 SENSITIVE Sensitive     NITROFURANTOIN 32 SENSITIVE Sensitive     TRIMETH/SULFA <=20 SENSITIVE Sensitive     AMPICILLIN/SULBACTAM 4 SENSITIVE Sensitive     PIP/TAZO <=4 SENSITIVE Sensitive     * 90,000 COLONIES/mL KLEBSIELLA PNEUMONIAE     Time coordinating discharge: 35 minutes  SIGNED:   Barb Merino, MD  [facility] 07/06/2022, 11:37 AM

## 2022-07-16 DIAGNOSIS — N390 Urinary tract infection, site not specified: Secondary | ICD-10-CM | POA: Diagnosis not present

## 2022-07-16 DIAGNOSIS — J101 Influenza due to other identified influenza virus with other respiratory manifestations: Secondary | ICD-10-CM | POA: Diagnosis not present

## 2022-07-16 DIAGNOSIS — N3000 Acute cystitis without hematuria: Secondary | ICD-10-CM | POA: Diagnosis not present

## 2022-07-16 DIAGNOSIS — R531 Weakness: Secondary | ICD-10-CM | POA: Diagnosis not present

## 2022-07-23 DIAGNOSIS — L304 Erythema intertrigo: Secondary | ICD-10-CM | POA: Diagnosis not present

## 2022-07-23 DIAGNOSIS — Z85828 Personal history of other malignant neoplasm of skin: Secondary | ICD-10-CM | POA: Diagnosis not present

## 2022-07-23 DIAGNOSIS — L820 Inflamed seborrheic keratosis: Secondary | ICD-10-CM | POA: Diagnosis not present

## 2022-07-23 DIAGNOSIS — L570 Actinic keratosis: Secondary | ICD-10-CM | POA: Diagnosis not present

## 2022-07-23 DIAGNOSIS — C44729 Squamous cell carcinoma of skin of left lower limb, including hip: Secondary | ICD-10-CM | POA: Diagnosis not present

## 2022-08-05 DIAGNOSIS — K080 Exfoliation of teeth due to systemic causes: Secondary | ICD-10-CM | POA: Diagnosis not present

## 2022-08-13 ENCOUNTER — Ambulatory Visit
Admission: RE | Admit: 2022-08-13 | Discharge: 2022-08-13 | Disposition: A | Discharge status: Home / Self Care | Payer: Medicare Other | Source: Ambulatory Visit | Attending: Internal Medicine | Admitting: Internal Medicine

## 2022-08-13 DIAGNOSIS — Z1231 Encounter for screening mammogram for malignant neoplasm of breast: Secondary | ICD-10-CM | POA: Diagnosis not present

## 2022-11-13 DIAGNOSIS — I8390 Asymptomatic varicose veins of unspecified lower extremity: Secondary | ICD-10-CM | POA: Diagnosis not present

## 2022-11-13 DIAGNOSIS — M79605 Pain in left leg: Secondary | ICD-10-CM | POA: Diagnosis not present

## 2022-11-14 ENCOUNTER — Ambulatory Visit (HOSPITAL_COMMUNITY)
Admission: RE | Admit: 2022-11-14 | Discharge: 2022-11-14 | Disposition: A | Discharge status: Home / Self Care | Payer: Medicare Other | Source: Ambulatory Visit | Attending: Vascular Surgery | Admitting: Vascular Surgery

## 2022-11-14 ENCOUNTER — Other Ambulatory Visit (HOSPITAL_COMMUNITY): Payer: Self-pay | Admitting: Family Medicine

## 2022-11-14 DIAGNOSIS — R52 Pain, unspecified: Secondary | ICD-10-CM | POA: Insufficient documentation

## 2022-12-06 DIAGNOSIS — M7061 Trochanteric bursitis, right hip: Secondary | ICD-10-CM | POA: Diagnosis not present

## 2022-12-06 DIAGNOSIS — M25532 Pain in left wrist: Secondary | ICD-10-CM | POA: Diagnosis not present

## 2022-12-06 DIAGNOSIS — M170 Bilateral primary osteoarthritis of knee: Secondary | ICD-10-CM | POA: Diagnosis not present

## 2022-12-11 DIAGNOSIS — M25522 Pain in left elbow: Secondary | ICD-10-CM | POA: Diagnosis not present

## 2022-12-11 DIAGNOSIS — M25532 Pain in left wrist: Secondary | ICD-10-CM | POA: Diagnosis not present

## 2022-12-11 DIAGNOSIS — M24139 Other articular cartilage disorders, unspecified wrist: Secondary | ICD-10-CM | POA: Diagnosis not present

## 2022-12-11 DIAGNOSIS — M24132 Other articular cartilage disorders, left wrist: Secondary | ICD-10-CM | POA: Diagnosis not present

## 2022-12-11 DIAGNOSIS — D1722 Benign lipomatous neoplasm of skin and subcutaneous tissue of left arm: Secondary | ICD-10-CM | POA: Diagnosis not present

## 2022-12-26 DIAGNOSIS — M7061 Trochanteric bursitis, right hip: Secondary | ICD-10-CM | POA: Diagnosis not present

## 2023-01-02 DIAGNOSIS — M25551 Pain in right hip: Secondary | ICD-10-CM | POA: Diagnosis not present

## 2023-01-14 DIAGNOSIS — D485 Neoplasm of uncertain behavior of skin: Secondary | ICD-10-CM | POA: Diagnosis not present

## 2023-01-14 DIAGNOSIS — L814 Other melanin hyperpigmentation: Secondary | ICD-10-CM | POA: Diagnosis not present

## 2023-01-14 DIAGNOSIS — D1801 Hemangioma of skin and subcutaneous tissue: Secondary | ICD-10-CM | POA: Diagnosis not present

## 2023-01-14 DIAGNOSIS — L570 Actinic keratosis: Secondary | ICD-10-CM | POA: Diagnosis not present

## 2023-01-14 DIAGNOSIS — L929 Granulomatous disorder of the skin and subcutaneous tissue, unspecified: Secondary | ICD-10-CM | POA: Diagnosis not present

## 2023-01-14 DIAGNOSIS — Z85828 Personal history of other malignant neoplasm of skin: Secondary | ICD-10-CM | POA: Diagnosis not present

## 2023-01-14 DIAGNOSIS — L821 Other seborrheic keratosis: Secondary | ICD-10-CM | POA: Diagnosis not present

## 2023-01-14 DIAGNOSIS — L820 Inflamed seborrheic keratosis: Secondary | ICD-10-CM | POA: Diagnosis not present

## 2023-01-16 DIAGNOSIS — M25551 Pain in right hip: Secondary | ICD-10-CM | POA: Diagnosis not present

## 2023-01-27 DIAGNOSIS — M25551 Pain in right hip: Secondary | ICD-10-CM | POA: Diagnosis not present

## 2023-02-06 DIAGNOSIS — M25532 Pain in left wrist: Secondary | ICD-10-CM | POA: Diagnosis not present

## 2023-02-06 DIAGNOSIS — M7061 Trochanteric bursitis, right hip: Secondary | ICD-10-CM | POA: Diagnosis not present

## 2023-02-11 DIAGNOSIS — L570 Actinic keratosis: Secondary | ICD-10-CM | POA: Diagnosis not present

## 2023-02-11 DIAGNOSIS — M8580 Other specified disorders of bone density and structure, unspecified site: Secondary | ICD-10-CM | POA: Diagnosis not present

## 2023-02-11 DIAGNOSIS — L718 Other rosacea: Secondary | ICD-10-CM | POA: Diagnosis not present

## 2023-02-11 DIAGNOSIS — L820 Inflamed seborrheic keratosis: Secondary | ICD-10-CM | POA: Diagnosis not present

## 2023-02-11 DIAGNOSIS — E785 Hyperlipidemia, unspecified: Secondary | ICD-10-CM | POA: Diagnosis not present

## 2023-02-13 DIAGNOSIS — D1722 Benign lipomatous neoplasm of skin and subcutaneous tissue of left arm: Secondary | ICD-10-CM | POA: Diagnosis not present

## 2023-02-13 DIAGNOSIS — M25522 Pain in left elbow: Secondary | ICD-10-CM | POA: Diagnosis not present

## 2023-02-13 DIAGNOSIS — M25832 Other specified joint disorders, left wrist: Secondary | ICD-10-CM | POA: Diagnosis not present

## 2023-02-13 DIAGNOSIS — M25572 Pain in left ankle and joints of left foot: Secondary | ICD-10-CM | POA: Diagnosis not present

## 2023-02-18 DIAGNOSIS — I10 Essential (primary) hypertension: Secondary | ICD-10-CM | POA: Diagnosis not present

## 2023-02-18 DIAGNOSIS — Z0000 Encounter for general adult medical examination without abnormal findings: Secondary | ICD-10-CM | POA: Diagnosis not present

## 2023-02-18 DIAGNOSIS — Z1331 Encounter for screening for depression: Secondary | ICD-10-CM | POA: Diagnosis not present

## 2023-02-18 DIAGNOSIS — R82998 Other abnormal findings in urine: Secondary | ICD-10-CM | POA: Diagnosis not present

## 2023-02-18 DIAGNOSIS — Z1339 Encounter for screening examination for other mental health and behavioral disorders: Secondary | ICD-10-CM | POA: Diagnosis not present

## 2023-02-18 DIAGNOSIS — R5383 Other fatigue: Secondary | ICD-10-CM | POA: Diagnosis not present

## 2023-02-20 DIAGNOSIS — M25551 Pain in right hip: Secondary | ICD-10-CM | POA: Diagnosis not present

## 2023-03-03 DIAGNOSIS — M13872 Other specified arthritis, left ankle and foot: Secondary | ICD-10-CM | POA: Diagnosis not present

## 2023-03-03 DIAGNOSIS — M13871 Other specified arthritis, right ankle and foot: Secondary | ICD-10-CM | POA: Diagnosis not present

## 2023-04-28 DIAGNOSIS — J40 Bronchitis, not specified as acute or chronic: Secondary | ICD-10-CM | POA: Diagnosis not present

## 2023-04-28 DIAGNOSIS — I10 Essential (primary) hypertension: Secondary | ICD-10-CM | POA: Diagnosis not present

## 2023-04-28 DIAGNOSIS — R051 Acute cough: Secondary | ICD-10-CM | POA: Diagnosis not present

## 2023-05-03 DIAGNOSIS — Z23 Encounter for immunization: Secondary | ICD-10-CM | POA: Diagnosis not present

## 2023-05-26 DIAGNOSIS — I10 Essential (primary) hypertension: Secondary | ICD-10-CM | POA: Diagnosis not present

## 2023-07-10 ENCOUNTER — Other Ambulatory Visit: Payer: Self-pay | Admitting: Internal Medicine

## 2023-07-10 DIAGNOSIS — Z1231 Encounter for screening mammogram for malignant neoplasm of breast: Secondary | ICD-10-CM

## 2023-08-08 DIAGNOSIS — L820 Inflamed seborrheic keratosis: Secondary | ICD-10-CM | POA: Diagnosis not present

## 2023-08-08 DIAGNOSIS — L821 Other seborrheic keratosis: Secondary | ICD-10-CM | POA: Diagnosis not present

## 2023-08-08 DIAGNOSIS — D045 Carcinoma in situ of skin of trunk: Secondary | ICD-10-CM | POA: Diagnosis not present

## 2023-08-08 DIAGNOSIS — D485 Neoplasm of uncertain behavior of skin: Secondary | ICD-10-CM | POA: Diagnosis not present

## 2023-08-08 DIAGNOSIS — D0439 Carcinoma in situ of skin of other parts of face: Secondary | ICD-10-CM | POA: Diagnosis not present

## 2023-08-08 DIAGNOSIS — L570 Actinic keratosis: Secondary | ICD-10-CM | POA: Diagnosis not present

## 2023-08-12 DIAGNOSIS — K080 Exfoliation of teeth due to systemic causes: Secondary | ICD-10-CM | POA: Diagnosis not present

## 2023-08-18 ENCOUNTER — Ambulatory Visit: Payer: Medicare Other

## 2023-08-19 ENCOUNTER — Ambulatory Visit
Admission: RE | Admit: 2023-08-19 | Discharge: 2023-08-19 | Disposition: A | Discharge status: Home / Self Care | Payer: Medicare Other | Source: Ambulatory Visit | Attending: Internal Medicine | Admitting: Internal Medicine

## 2023-08-19 DIAGNOSIS — I10 Essential (primary) hypertension: Secondary | ICD-10-CM | POA: Diagnosis not present

## 2023-08-19 DIAGNOSIS — Z1231 Encounter for screening mammogram for malignant neoplasm of breast: Secondary | ICD-10-CM

## 2023-08-21 DIAGNOSIS — D045 Carcinoma in situ of skin of trunk: Secondary | ICD-10-CM | POA: Diagnosis not present

## 2023-08-21 DIAGNOSIS — L820 Inflamed seborrheic keratosis: Secondary | ICD-10-CM | POA: Diagnosis not present

## 2023-11-08 IMAGING — MG DIGITAL DIAGNOSTIC BILAT W/ TOMO W/ CAD
6 of 10 series · 6 of 30 positions shown · non-contrast
Comparison: Previous exam(s).

ACR Breast Density Category a: The breast tissue is almost entirely
fatty.

CLINICAL DATA: 80-year-old female for 2 year follow-up of likely
benign RIGHT breast masses.

EXAM:
DIGITAL DIAGNOSTIC BILATERAL MAMMOGRAM WITH TOMOSYNTHESIS AND CAD;
ULTRASOUND RIGHT BREAST LIMITED
TECHNIQUE: Bilateral digital diagnostic mammography and breast tomosynthesis
was performed. The images were evaluated with computer-aided
detection.; Targeted ultrasound examination of the right breast was
performed

[R CC synth-2D (1 of 2)]
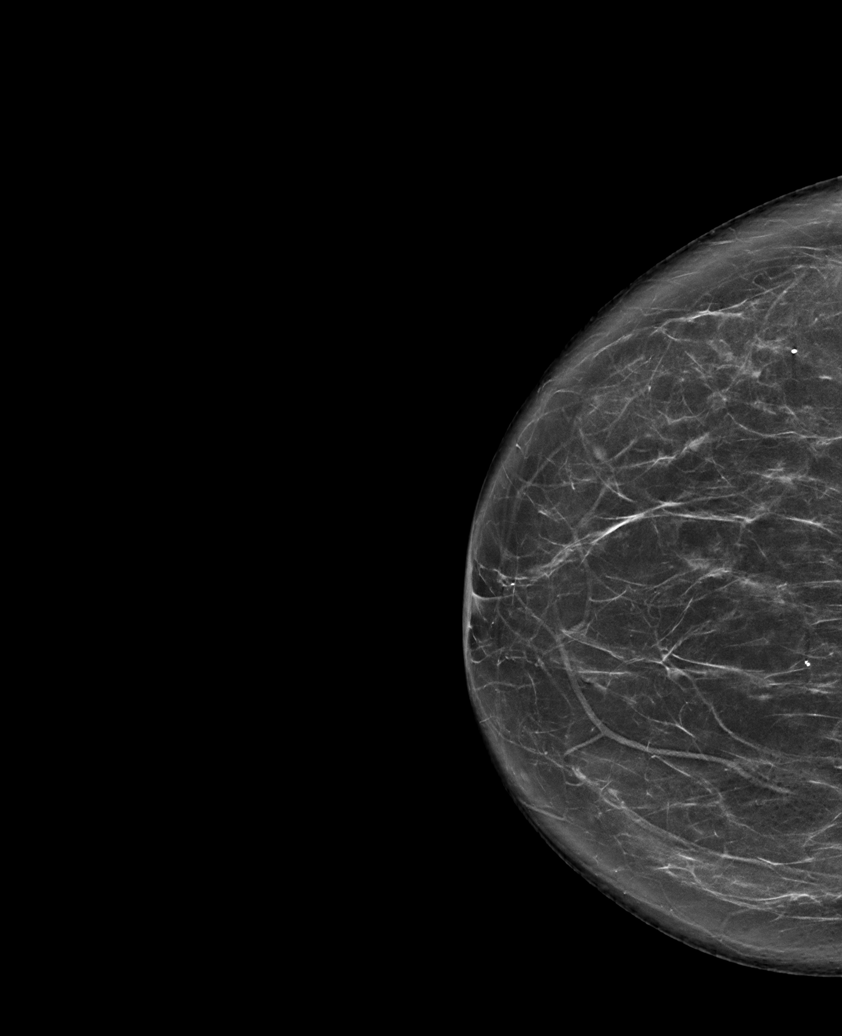

[R MLO synth-2D]
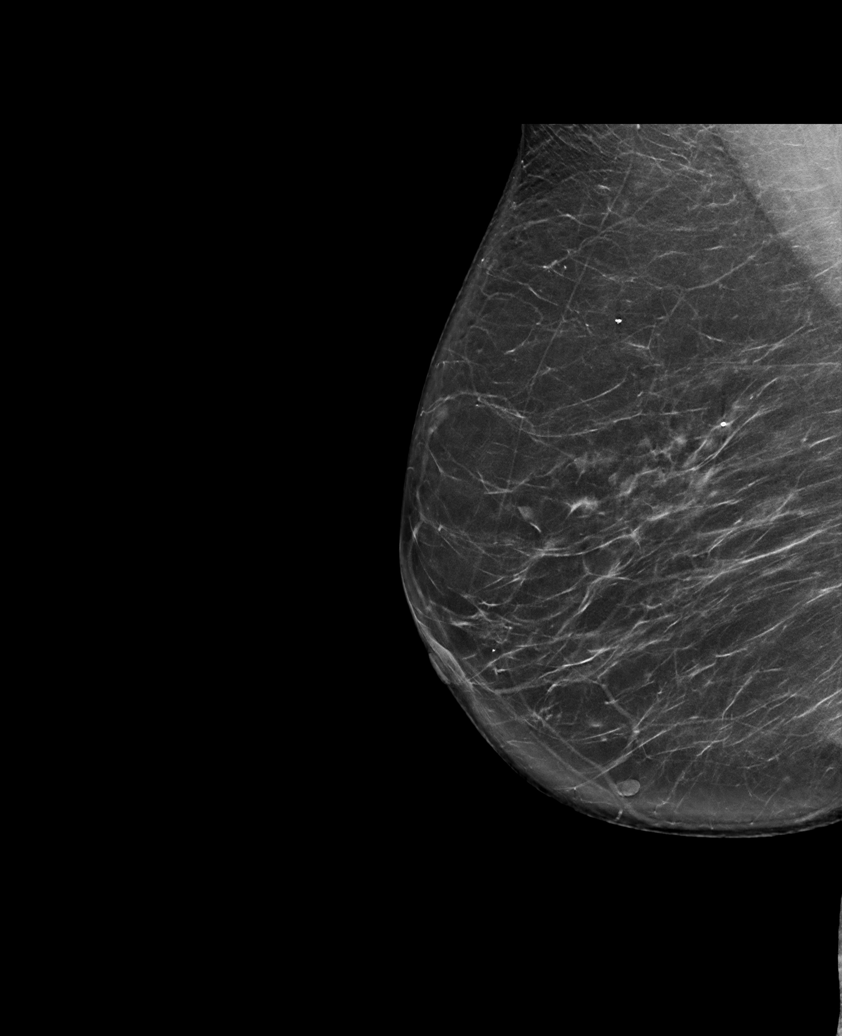

[L MLO synth-2D]
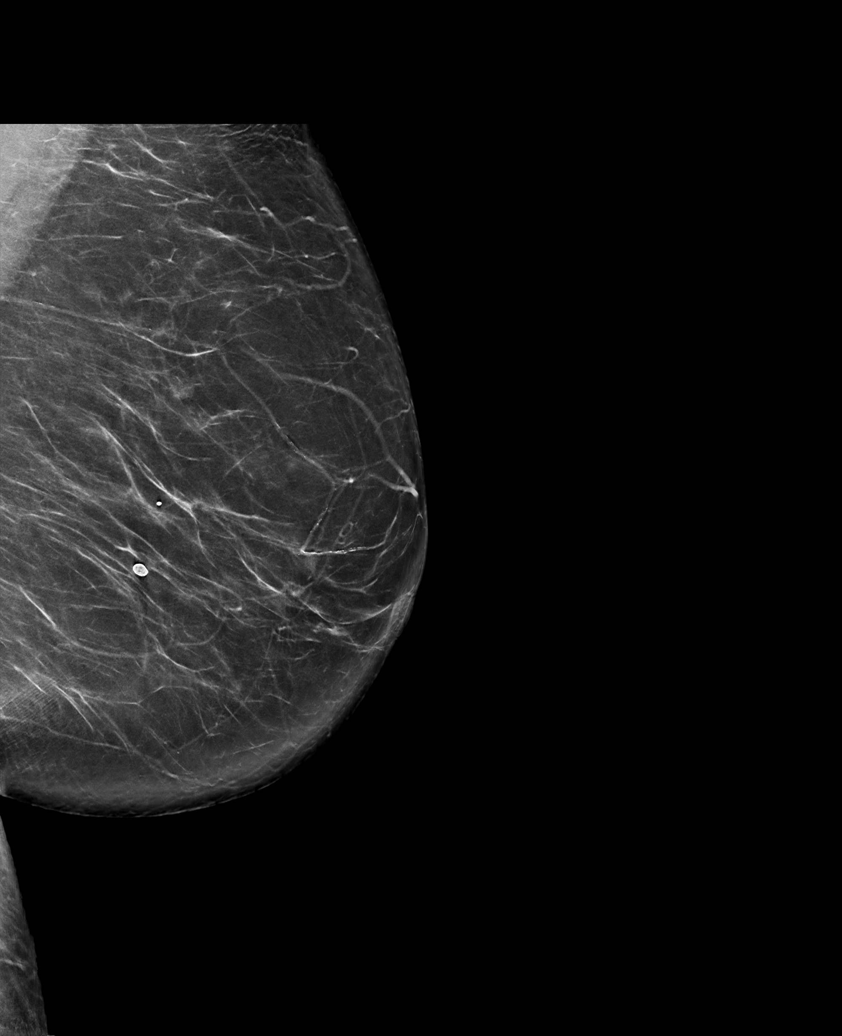

[L CC synth-2D]
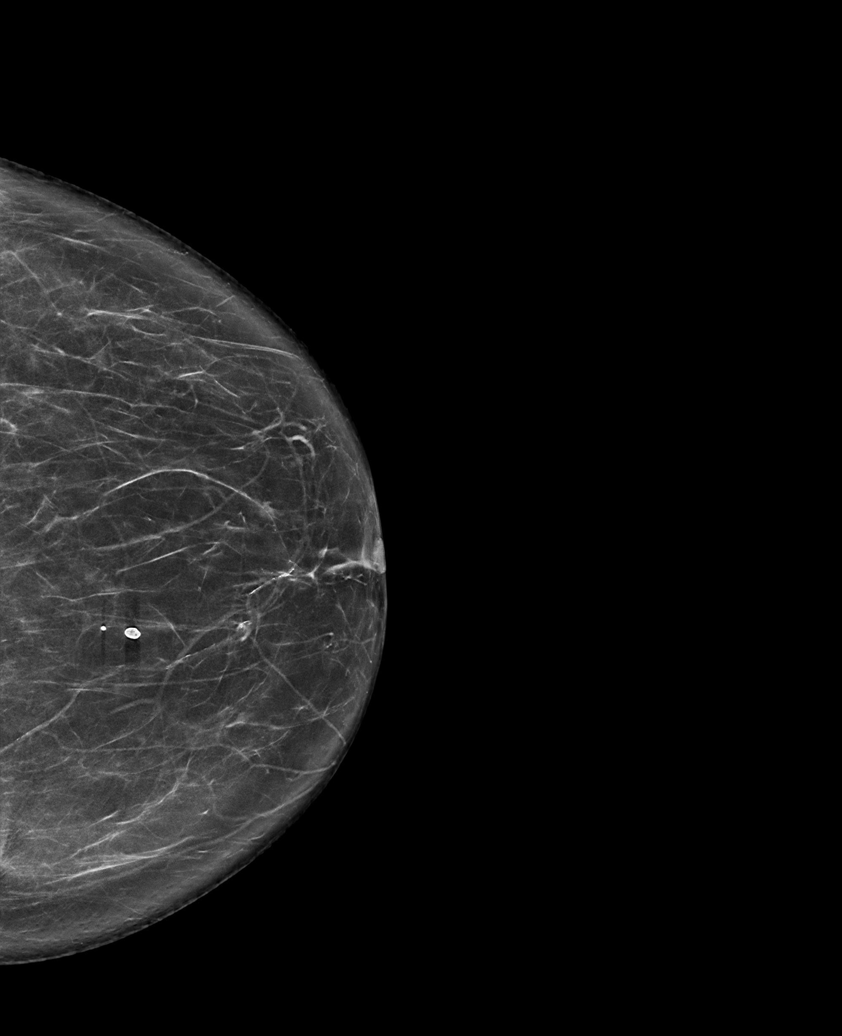

[R CC synth-2D (2 of 2)]
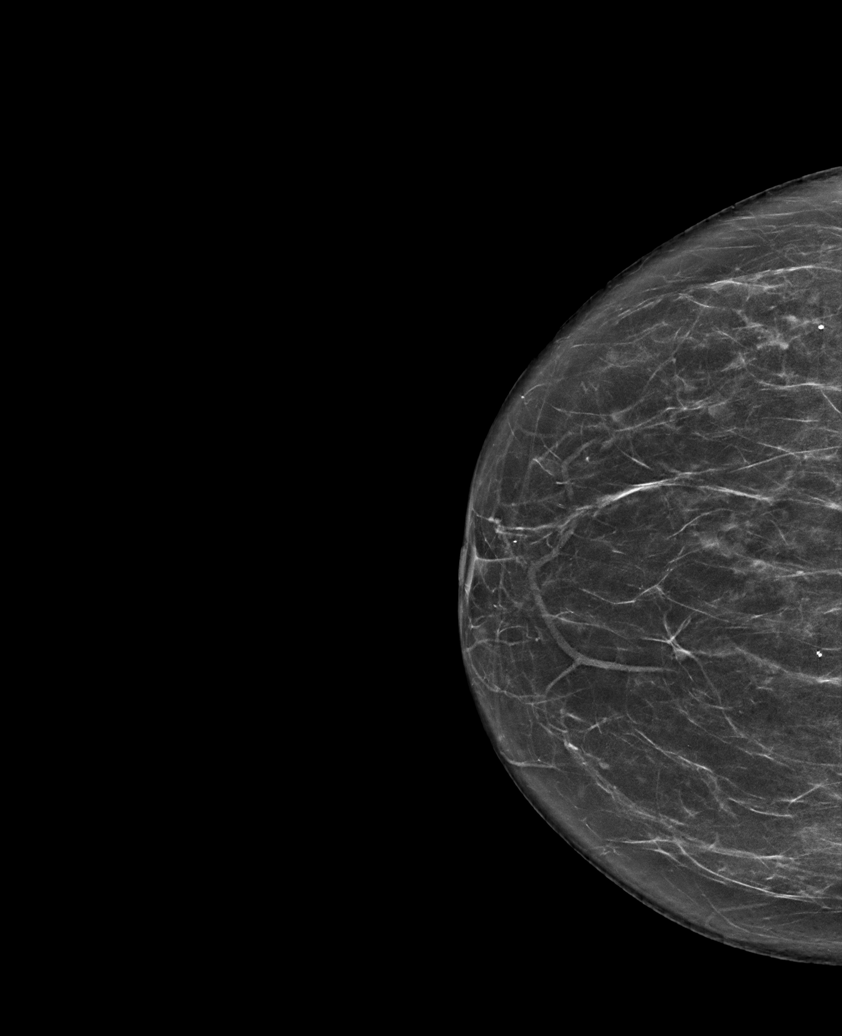

[R CC tomo · tomo slice 41/81.0]
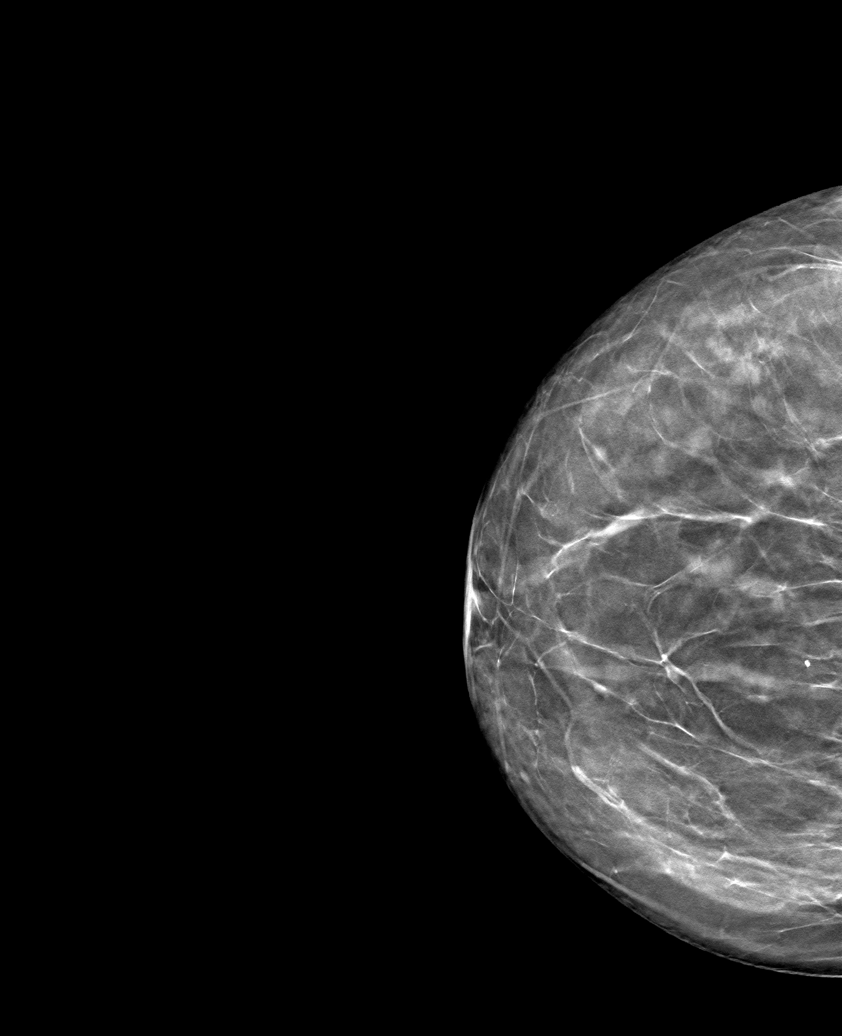

[6 of 30 positions shown; findings below may reference images not displayed]

FINDINGS: 2D/3D full field views of both breasts demonstrate no new or
suspicious findings within either breast.

Targeted ultrasound of the RIGHT breast is performed, showing the
following:

A 0.2 x 0.1 x 0.2 cm circumscribed oval hypoechoic parallel mass at
the 12 o'clock position 3 cm from the nipple previously 0.7 x 0.2 x
0.4 cm.

A 0.3 x 0.2 x 0.4 cm circumscribed oval hypoechoic parallel mass at
the 10 o'clock position 3 cm from the nipple, previously 0.4 x 0.2 x
0.4 cm.
IMPRESSION: 1. Slightly decreased size of 2 RIGHT breast masses since 9898,
compatible with benign etiologies.
2. No new or suspicious mammographic findings within either breast.

RECOMMENDATION:
Bilateral screening mammogram in 1 year as clinically indicated.

I have discussed the findings and recommendations with the patient.
If applicable, a reminder letter will be sent to the patient
regarding the next appointment.

BI-RADS CATEGORY  2: Benign.

## 2023-12-02 DIAGNOSIS — R051 Acute cough: Secondary | ICD-10-CM | POA: Diagnosis not present

## 2023-12-02 DIAGNOSIS — J069 Acute upper respiratory infection, unspecified: Secondary | ICD-10-CM | POA: Diagnosis not present

## 2023-12-02 DIAGNOSIS — R0989 Other specified symptoms and signs involving the circulatory and respiratory systems: Secondary | ICD-10-CM | POA: Diagnosis not present

## 2023-12-02 DIAGNOSIS — R062 Wheezing: Secondary | ICD-10-CM | POA: Diagnosis not present

## 2024-01-20 DIAGNOSIS — Z85828 Personal history of other malignant neoplasm of skin: Secondary | ICD-10-CM | POA: Diagnosis not present

## 2024-01-20 DIAGNOSIS — L570 Actinic keratosis: Secondary | ICD-10-CM | POA: Diagnosis not present

## 2024-01-20 DIAGNOSIS — D1801 Hemangioma of skin and subcutaneous tissue: Secondary | ICD-10-CM | POA: Diagnosis not present

## 2024-01-20 DIAGNOSIS — L821 Other seborrheic keratosis: Secondary | ICD-10-CM | POA: Diagnosis not present

## 2024-01-20 DIAGNOSIS — L814 Other melanin hyperpigmentation: Secondary | ICD-10-CM | POA: Diagnosis not present

## 2024-02-12 DIAGNOSIS — H5203 Hypermetropia, bilateral: Secondary | ICD-10-CM | POA: Diagnosis not present

## 2024-02-24 DIAGNOSIS — I10 Essential (primary) hypertension: Secondary | ICD-10-CM | POA: Diagnosis not present

## 2024-02-24 DIAGNOSIS — E7849 Other hyperlipidemia: Secondary | ICD-10-CM | POA: Diagnosis not present

## 2024-02-24 DIAGNOSIS — E785 Hyperlipidemia, unspecified: Secondary | ICD-10-CM | POA: Diagnosis not present

## 2024-02-24 DIAGNOSIS — Z1389 Encounter for screening for other disorder: Secondary | ICD-10-CM | POA: Diagnosis not present

## 2024-02-24 DIAGNOSIS — E559 Vitamin D deficiency, unspecified: Secondary | ICD-10-CM | POA: Diagnosis not present

## 2024-03-11 DIAGNOSIS — I498 Other specified cardiac arrhythmias: Secondary | ICD-10-CM | POA: Diagnosis not present

## 2024-03-11 DIAGNOSIS — I10 Essential (primary) hypertension: Secondary | ICD-10-CM | POA: Diagnosis not present

## 2024-03-11 DIAGNOSIS — Z23 Encounter for immunization: Secondary | ICD-10-CM | POA: Diagnosis not present

## 2024-03-11 DIAGNOSIS — Z1339 Encounter for screening examination for other mental health and behavioral disorders: Secondary | ICD-10-CM | POA: Diagnosis not present

## 2024-03-11 DIAGNOSIS — Z1331 Encounter for screening for depression: Secondary | ICD-10-CM | POA: Diagnosis not present

## 2024-03-11 DIAGNOSIS — Z0000 Encounter for general adult medical examination without abnormal findings: Secondary | ICD-10-CM | POA: Diagnosis not present

## 2024-03-11 DIAGNOSIS — R82998 Other abnormal findings in urine: Secondary | ICD-10-CM | POA: Diagnosis not present

## 2024-03-11 DIAGNOSIS — R0781 Pleurodynia: Secondary | ICD-10-CM | POA: Diagnosis not present

## 2024-03-16 DIAGNOSIS — N390 Urinary tract infection, site not specified: Secondary | ICD-10-CM | POA: Diagnosis not present

## 2024-03-16 DIAGNOSIS — R350 Frequency of micturition: Secondary | ICD-10-CM | POA: Diagnosis not present

## 2024-03-16 DIAGNOSIS — I10 Essential (primary) hypertension: Secondary | ICD-10-CM | POA: Diagnosis not present

## 2024-03-29 DIAGNOSIS — M170 Bilateral primary osteoarthritis of knee: Secondary | ICD-10-CM | POA: Diagnosis not present

## 2024-04-13 DIAGNOSIS — M170 Bilateral primary osteoarthritis of knee: Secondary | ICD-10-CM | POA: Diagnosis not present

## 2024-04-20 DIAGNOSIS — M170 Bilateral primary osteoarthritis of knee: Secondary | ICD-10-CM | POA: Diagnosis not present

## 2024-04-27 DIAGNOSIS — M170 Bilateral primary osteoarthritis of knee: Secondary | ICD-10-CM | POA: Diagnosis not present

## 2024-05-03 DIAGNOSIS — M779 Enthesopathy, unspecified: Secondary | ICD-10-CM | POA: Diagnosis not present

## 2024-05-25 DIAGNOSIS — I10 Essential (primary) hypertension: Secondary | ICD-10-CM | POA: Diagnosis not present

## 2024-06-22 ENCOUNTER — Emergency Department

## 2024-06-22 ENCOUNTER — Emergency Department (HOSPITAL_COMMUNITY)

## 2024-06-22 ENCOUNTER — Other Ambulatory Visit: Payer: Self-pay | Admitting: Cardiology

## 2024-06-22 ENCOUNTER — Emergency Department (HOSPITAL_COMMUNITY)
Admission: EM | Admit: 2024-06-22 | Discharge: 2024-06-22 | Disposition: A | Discharge status: Home / Self Care | Attending: Emergency Medicine | Admitting: Emergency Medicine

## 2024-06-22 ENCOUNTER — Other Ambulatory Visit: Payer: Self-pay

## 2024-06-22 DIAGNOSIS — Z79899 Other long term (current) drug therapy: Secondary | ICD-10-CM | POA: Diagnosis not present

## 2024-06-22 DIAGNOSIS — R55 Syncope and collapse: Secondary | ICD-10-CM

## 2024-06-22 DIAGNOSIS — Z7982 Long term (current) use of aspirin: Secondary | ICD-10-CM | POA: Diagnosis not present

## 2024-06-22 DIAGNOSIS — I10 Essential (primary) hypertension: Secondary | ICD-10-CM | POA: Diagnosis not present

## 2024-06-22 LAB — CBC
HCT: 41.1 % (ref 36.0–46.0)
Hemoglobin: 12.9 g/dL (ref 12.0–15.0)
MCH: 29.1 pg (ref 26.0–34.0)
MCHC: 31.4 g/dL (ref 30.0–36.0)
MCV: 92.6 fL (ref 80.0–100.0)
Platelets: 225 K/uL (ref 150–400)
RBC: 4.44 MIL/uL (ref 3.87–5.11)
RDW: 13.2 % (ref 11.5–15.5)
WBC: 7.1 K/uL (ref 4.0–10.5)
nRBC: 0 % (ref 0.0–0.2)

## 2024-06-22 LAB — CBG MONITORING, ED: Glucose-Capillary: 125 mg/dL — ABNORMAL HIGH (ref 70–99)

## 2024-06-22 LAB — URINALYSIS, ROUTINE W REFLEX MICROSCOPIC
Bilirubin Urine: NEGATIVE
Glucose, UA: NEGATIVE mg/dL
Ketones, ur: NEGATIVE mg/dL
Nitrite: NEGATIVE
Protein, ur: NEGATIVE mg/dL
Specific Gravity, Urine: 1.01 (ref 1.005–1.030)
pH: 7 (ref 5.0–8.0)

## 2024-06-22 LAB — URINALYSIS, MICROSCOPIC (REFLEX)

## 2024-06-22 LAB — COMPREHENSIVE METABOLIC PANEL WITH GFR
ALT: 26 U/L (ref 0–44)
AST: 39 U/L (ref 15–41)
Albumin: 3.3 g/dL — ABNORMAL LOW (ref 3.5–5.0)
Alkaline Phosphatase: 57 U/L (ref 38–126)
Anion gap: 10 (ref 5–15)
BUN: 14 mg/dL (ref 8–23)
CO2: 25 mmol/L (ref 22–32)
Calcium: 8.8 mg/dL — ABNORMAL LOW (ref 8.9–10.3)
Chloride: 104 mmol/L (ref 98–111)
Creatinine, Ser: 1.01 mg/dL — ABNORMAL HIGH (ref 0.44–1.00)
GFR, Estimated: 55 mL/min — ABNORMAL LOW (ref >60)
Glucose, Bld: 133 mg/dL — ABNORMAL HIGH (ref 70–99)
Potassium: 4.6 mmol/L (ref 3.5–5.1)
Sodium: 138 mmol/L (ref 135–145)
Total Bilirubin: 0.5 mg/dL (ref 0.0–1.2)
Total Protein: 6 g/dL — ABNORMAL LOW (ref 6.5–8.1)

## 2024-06-22 MED ORDER — LACTATED RINGERS IV SOLN: INTRAVENOUS | Status: DC

## 2024-06-22 MED ORDER — LACTATED RINGERS IV BOLUS
1000.0000 mL | Freq: Once | INTRAVENOUS | Status: AC
  Administered 2024-06-22: 12:00:00 1000 mL via INTRAVENOUS

## 2024-06-22 NOTE — ED Provider Notes (Signed)
 " [facility] Provider Note   CSN: [id number] Arrival date & time: 06/22/24  1104     Patient presents with: Loss of Consciousness   Cynthia Jenkins is a 83 y.o. female with a past medical history of HTN and HLD who presents to the ED via EMS for loss of consciousness. She states she was getting her hair done at the salon having normal conversation with the hairdresser, then suddenly woke up to her hairdresser [employer]. She denies any prodromal symptoms such as nausea, vomiting, warmth, headache, and denies hitting her head, biting her tongue, or any incontinence. She is unsure how long she was passed out' for. Overall, she felt fine this morning and has not been around any sick contacts. Denies any urinary symptoms like urgency, frequency, or dysuria. Last BM was today. She notes she felt slightly nauseated when she woke up, but this went away after she ate breakfast. She did have minimal to drink (coffee) and endorses feeling dehydrated.  On EMS, her BP was 84/64. She was started on 250cc of NS, which normalized her blood pressure to 112/100. The patient states she has had a similar syncopal episode years ago, however recalls that it was not due to a cardiac cause.    The history is provided by the patient.  Loss of Consciousness      Prior to Admission medications  Medication Sig Start Date End Date Taking? Authorizing Provider  albuterol  (VENTOLIN  HFA) 108 (90 Base) MCG/ACT inhaler Inhale 2 puffs into the lungs every 6 (six) hours as needed for wheezing or shortness of breath. 07/06/22   Raenelle Coria, MD  amLODipine  (NORVASC ) 5 MG tablet Take 5 mg by mouth daily.    [provider]  aspirin  81 MG tablet Take 81 mg by mouth daily.      [provider]  buPROPion  (WELLBUTRIN  XL) 150 MG 24 hr tablet Take 150 mg by mouth daily. 04/01/17   [provider]  carvedilol  (COREG ) 12.5 MG tablet Take 12.5 mg by mouth 2  (two) times daily with a meal.    [provider]  Cholecalciferol (VITAMIN D PO) Take 1 tablet by mouth daily.     [provider]  Cyanocobalamin (VITAMIN B 12 PO) Take 1 tablet by mouth daily.     [provider]  fluticasone (FLONASE) 50 MCG/ACT nasal spray Place 1 spray into both nostrils daily as needed for allergies. 06/08/18   [provider]  guaiFENesin -dextromethorphan (ROBITUSSIN DM) 100-10 MG/5ML syrup Take 5 mLs by mouth every 4 (four) hours as needed for cough. 07/06/22   Ghimire, Kuber, MD  loratadine (CLARITIN) 10 MG tablet Take 10 mg by mouth daily as needed for allergies.    [provider]  losartan -hydrochlorothiazide  (HYZAAR) 100-12.5 MG per tablet Take 1 tablet by mouth daily. 09/13/14   Dominick Ned, MD  Omega-3 Fatty Acids (FISH OIL PO) Take 1 capsule by mouth daily.     [provider]  omeprazole (PRILOSEC) 40 MG capsule Take 40 mg by mouth daily. 06/23/18   [provider]  ondansetron  (ZOFRAN -ODT) 8 MG disintegrating tablet Take 8 mg by mouth every 8 (eight) hours as needed for vomiting or nausea.    [provider]  pravastatin  (PRAVACHOL ) 40 MG tablet Take 1 tablet (40 mg total) by mouth daily. 09/13/14   Dominick Ned, MD  traMADol  (ULTRAM ) 50 MG tablet Take 1 tablet (50 mg total) by mouth every 6 (  six) hours as needed. Patient taking differently: Take 50 mg by mouth every 6 (six) hours as needed for moderate pain. 04/28/17   Harris, Abigail, PA-C    Allergies: Aceon [perindopril erbumine], Codeine, Crestor [rosuvastatin calcium], Diovan [valsartan], Iodine, Norvasc  [amlodipine  besylate], and Zetia [ezetimibe]    Review of Systems  Constitutional: Negative.   HENT: Negative.    Respiratory: Negative.    Cardiovascular:  Positive for syncope.  Gastrointestinal: Negative.   Genitourinary: Negative.   Skin:        Dry   Hematological: Negative.   Psychiatric/Behavioral: Negative.       Updated Vital Signs BP 138/74   Pulse 62   Temp 98.4 F (36.9 C) (Oral)   Resp 15   Ht 5' 7 (1.702 m)   Wt 77.1 kg   SpO2 100%   BMI 26.63 kg/m   Physical Exam Constitutional:      General: She is not in acute distress.    Appearance: She is not ill-appearing.  HENT:     Mouth/Throat:     Mouth: Mucous membranes are dry.  Cardiovascular:     Rate and Rhythm: Normal rate and regular rhythm.     Pulses: Normal pulses.     Heart sounds: Normal heart sounds.  Pulmonary:     Effort: Pulmonary effort is normal.     Breath sounds: Normal breath sounds.  Abdominal:     General: Abdomen is flat.     Palpations: Abdomen is soft.  Musculoskeletal:        General: No swelling.     Right lower leg: No edema.     Left lower leg: No edema.     Comments: Bilateral radial pulses 2+  Skin:    General: Skin is warm and dry.  Neurological:     General: No focal deficit present.     Mental Status: She is alert and oriented to person, place, and time.  Psychiatric:        Mood and Affect: Mood normal.        Behavior: Behavior normal.     (all labs ordered are listed, but only abnormal results are displayed) Labs Reviewed  COMPREHENSIVE METABOLIC PANEL WITH GFR - Abnormal; Notable for the following components:      Result Value   Glucose, Bld 133 (*)    Creatinine, Ser 1.01 (*)    Calcium 8.8 (*)    Total Protein 6.0 (*)    Albumin 3.3 (*)    GFR, Estimated 55 (*)    All other components within normal limits  URINALYSIS, ROUTINE W REFLEX MICROSCOPIC - Abnormal; Notable for the following components:   Hgb urine dipstick TRACE (*)    Leukocytes,Ua TRACE (*)    All other components within normal limits  URINALYSIS, MICROSCOPIC (REFLEX) - Abnormal; Notable for the following components:   Bacteria, UA RARE (*)    All other components within normal limits  CBG MONITORING, ED - Abnormal; Notable for the following components:   Glucose-Capillary 125 (*)    All other  components within normal limits  CBC    EKG: EKG Interpretation Date/Time:  Tuesday June 22 2024 11:38:26 EST Ventricular Rate:  71 PR Interval:  132 QRS Duration:  116 QT Interval:  436 QTC Calculation: 454 R Axis:   11  Text Interpretation: Poor data quality in current ECG precludes serial comparison Confirmed by Dean Clarity [phone]) on 06/22/2024 12:03:58 PM  Radiology: DG Chest 2 View Result Date: 06/22/2024 CLINICAL  DATA:  Syncopal episode this morning. EXAM: CHEST - 2 VIEW COMPARISON:  07/03/2022 FINDINGS: Lungs are adequately inflated and otherwise clear. Cardiomediastinal silhouette and remainder of the exam is unchanged. IMPRESSION: No active cardiopulmonary disease. Electronically Signed   By: Toribio Agreste M.D.   On: 06/22/2024 12:16     Procedures   Medications Ordered in the ED  lactated ringers  infusion ( Intravenous New Bag/Given 06/22/24 1335)  lactated ringers  bolus 1,000 mL (0 mLs Intravenous Stopped 06/22/24 1335)                                    Medical Decision Making Jelisha Weed presents to the ED for syncopal event. She has a history of a similar episode 5 years ago, and endorses a history of premature atrial contractions. No recent medication changes. Per EMS,  initial BP was 84/60 and after NS it increased to 124/78. Patient given 1L LR in ED and BP improved, now between 120-130s systolic. This involves an extensive number of treatment options, and is a complaint that carries with it a high risk of complications and morbidity. The differential diagnosis includes CVA, ICH, intracranial mass, critical dehydration, heptatic dysfunction, uremia, hypercarbia, intoxication/withdrawal, endocrine abnormality, sepsis/infection, vestibular neuritis, peripheral vertigo.  CBC, CMP, and UA WNL. CXR showing no cardiopulmonary changes or signs of infectious etiology. EKG showing sinus rhythm with premature atrial contractions. On reevaluation, patient  states she feels well and is able to ambulate without dizziness, headache, nausea, or changes in her BP. Orthostatics negative. Curbside consulted cardiology for other considerations, and they agreed patient is stable for discharge. Will send patient a Zio Patch for outpatient monitoring and have her establish with cardiology in a few weeks. Discussed plan with the patient and her husband, and they vocalized understanding and are agreeable to this. Also discussed patient's blood pressure medications would benefit from being reevaluated and reconciled outpatient with her primary care doctor. Informed patient to continue to stay hydrated and drink an appropriate amount of water intake, as well as watching her salt intake. Patient voiced understanding.    Co morbidities that complicate the patient evaluation: none   Consultations Obtained: cardiology  I requested consultation with the cardiology,  and discussed lab and imaging findings as well as pertinent plan - they recommend: Zio patch and outpatient follow up.    Problem List / ED Course / Critical interventions / Medication management  Patient presented for syncopal episode.  I ordered, and personally interpreted labs.  CBC, blood glucose, CMP, and UA within normal limits.   The patient was maintained on a cardiac monitor.  I personally viewed and interpreted the cardiac monitored which showed an underlying sinus rhythm with premature atrial contractions. QTc normal at 396. I ordered imaging studies including CXR . I independently visualized and interpreted imaging which showed no cardiopulmonary disease or process. I agree with the radiologist interpretation.   Patient without headache, neck pain, diplopia, dysarthria, dysmetria, dysphonia, dysphagia, vertical nystagmus. Neuro exam is unremarkable. Orthostatics negative. She is able to ambulate in the ED without difficulties. These findings are reassuring that patient is not suffering from an  acute stroke at this time or other cardiogenic causes.    I have reviewed the patients home medicines, and have discussed with the patient and her husband to follow up with PCP for any further changes.  The patient has been appropriately medically screened and/or stabilized in the  ED. I have low suspicion for any other emergent medical condition which would require further screening, evaluation or treatment in the ED or require inpatient management. At time of discharge the patient is hemodynamically stable and in no acute distress. I have discussed work-up results and diagnosis with patient and answered all questions. Patient is agreeable with discharge plan. We discussed strict return precautions for returning to the emergency department and they verbalized understanding.     DDx: These were considered less likely due to history of present illness and physical exam findings -Critical dehydration/heptatic dysfunction/Uremia: CMP without concern -Hypercarbia/Intoxication/withdrawal: patient history inconsistent with this diagnosis  -Endocrine abnormality: vitals reassuring -Sepsis: patient afebrile with stable vitals   Social Determinants of Health: none  Amount and/or Complexity of Data Reviewed Labs: ordered. Radiology: ordered.  Risk Prescription drug management.      Final diagnoses:  Syncope, unspecified syncope type    ED Discharge Orders     None         Florabel Faulks, DO 06/22/24 1540    Dean Clarity, MD 07/05/24 1536  "

## 2024-06-22 NOTE — Progress Notes (Unsigned)
 Enrolled for [employer] to mail a ZIO AT Live Telemetry monitor to patients address on file.   Dr. Kriste to read.

## 2024-06-22 NOTE — Discharge Instructions (Addendum)
 You were brought to the hospital for what is called a syncopal episode, or a temporary loss of consciousness. We checked your heart, lungs, and blood work to check for possible reasons this could have occurred. Your blood work was normal and did not show any signs of infection. Your chest xray was clear for any infection, and your EKG showed what is called a premature atrial contraction (PACs), or the extra beat in the upper chambers of your heart. These PACs are what you have had before when you had your syncopal episode 5 years ago.   I recommend following up with Dr. Janey within a week to discuss changing your blood pressure medicines so long as your blood pressure is staying controlled.  It would be a good idea to follow up with a cardiologist outpatient. I have placed a referral for you to see a cardiologist to discuss why these might be happening. You will be sent a monitor called a Zio Patch to your house and will follow up with their office in a few weeks to see how your heart rhythm is doing.   If you notice any new shaking, nausea or vomiting, or any other episodes of loss of consciousness, especially if you hit your head, please return to the emergency department for further evaluation.

## 2024-06-22 NOTE — Progress Notes (Signed)
 Requested by ER provider to order 2 week Zio monitor for patient with cardiology follow up shortly after. Will send monitor results to Dr. Kriste and arrange follow up appointment with him. Monitor will be mailed to patients house.

## 2024-06-22 NOTE — ED Triage Notes (Signed)
 Patient bib EMS after a syncopal episode while getting her her done. EMS reports that her initial BP was 84/60 and after NS it increased to 124/78. GCS of 15, EMS also reports PAC's and possibly afb on monitor.

## 2024-07-02 ENCOUNTER — Ambulatory Visit (HOSPITAL_COMMUNITY)
Admission: RE | Admit: 2024-07-02 | Discharge: 2024-07-02 | Disposition: A | Discharge status: Home / Self Care | Source: Ambulatory Visit | Attending: Vascular Surgery | Admitting: Vascular Surgery

## 2024-07-02 ENCOUNTER — Other Ambulatory Visit (HOSPITAL_COMMUNITY): Payer: Self-pay | Admitting: Family Medicine

## 2024-07-02 DIAGNOSIS — R55 Syncope and collapse: Secondary | ICD-10-CM

## 2024-07-15 ENCOUNTER — Other Ambulatory Visit: Payer: Self-pay | Admitting: Internal Medicine

## 2024-07-15 DIAGNOSIS — Z1231 Encounter for screening mammogram for malignant neoplasm of breast: Secondary | ICD-10-CM

## 2024-07-19 DIAGNOSIS — R55 Syncope and collapse: Secondary | ICD-10-CM

## 2024-07-20 ENCOUNTER — Ambulatory Visit: Payer: Self-pay | Admitting: Internal Medicine

## 2024-07-29 ENCOUNTER — Ambulatory Visit: Admitting: Internal Medicine

## 2024-08-05 ENCOUNTER — Ambulatory Visit (HOSPITAL_COMMUNITY)
Admission: RE | Admit: 2024-08-05 | Discharge: 2024-08-05 | Disposition: A | Discharge status: Home / Self Care | Source: Ambulatory Visit | Attending: Internal Medicine | Admitting: Internal Medicine

## 2024-08-05 ENCOUNTER — Ambulatory Visit: Attending: Internal Medicine | Admitting: Internal Medicine

## 2024-08-05 ENCOUNTER — Encounter: Payer: Self-pay | Admitting: Internal Medicine

## 2024-08-05 VITALS — BP 130/78 | HR 74 | Ht 67.0 in | Wt 175.7 lb

## 2024-08-05 DIAGNOSIS — E782 Mixed hyperlipidemia: Secondary | ICD-10-CM

## 2024-08-05 DIAGNOSIS — R0602 Shortness of breath: Secondary | ICD-10-CM | POA: Diagnosis present

## 2024-08-05 DIAGNOSIS — I10 Essential (primary) hypertension: Secondary | ICD-10-CM | POA: Diagnosis not present

## 2024-08-05 DIAGNOSIS — R9431 Abnormal electrocardiogram [ECG] [EKG]: Secondary | ICD-10-CM | POA: Diagnosis not present

## 2024-08-05 DIAGNOSIS — R55 Syncope and collapse: Secondary | ICD-10-CM

## 2024-08-05 DIAGNOSIS — I491 Atrial premature depolarization: Secondary | ICD-10-CM

## 2024-08-05 MED ORDER — METOPROLOL SUCCINATE ER 25 MG PO TB24: 25.0000 mg | ORAL_TABLET | Freq: Every day | ORAL | 3 refills | Status: DC

## 2024-08-05 MED ORDER — METOPROLOL SUCCINATE ER 25 MG PO TB24: 25.0000 mg | ORAL_TABLET | Freq: Every day | ORAL | 0 refills | Status: AC

## 2024-08-05 NOTE — Patient Instructions (Signed)
 Medication Instructions:   STOP carvedilol   START metoprolol  succinate (toprol  XL) 25mg  once daily  *If you need a refill on your cardiac medications before your next appointment, please call your pharmacy*  Lab Work:  STOP BY THE LAB on the 1st floor  If you have labs (blood work) drawn today and your tests are completely normal, you will receive your results only by: MyChart Message (if you have MyChart) OR A paper copy in the mail If you have any lab test that is abnormal or we need to change your treatment, we will call you to review the results.  Testing/Procedures:  TODAY: Chest X-Ray  To Be Scheduled: Your physician has requested that you have an echocardiogram. Echocardiography is a painless test that uses sound waves to create images of your heart. It provides your doctor with information about the size and shape of your heart and how well your hearts chambers and valves are working. This procedure takes approximately one hour. There are no restrictions for this procedure. Please do NOT wear cologne, perfume, aftershave, or lotions (deodorant is allowed). Please arrive 15 minutes prior to your appointment time.  Please note: We ask at that you not bring children with you during ultrasound (echo/ vascular) testing. Due to room size and safety concerns, children are not allowed in the ultrasound rooms during exams. Our front office staff cannot provide observation of children in our lobby area while testing is being conducted. An adult accompanying a patient to their appointment will only be allowed in the ultrasound room at the discretion of the ultrasound technician under special circumstances. We apologize for any inconvenience.  Cardiac PET Stress Test @ [facility] -- you will get a call to schedule this once approved with insurance. Instructions below.    Follow-Up: At [facility], you and your health needs are our priority.  As part of our continuing mission to  provide you with exceptional heart care, our providers are all part of one team.  This team includes your primary Cardiologist (physician) and Advanced Practice Providers or APPs (Physician Assistants and Nurse Practitioners) who all work together to provide you with the care you need, when you need it.  Your next appointment:     3 months with Dr. Kriste  We recommend signing up for the patient portal called MyChart.  Sign up information is provided on this After Visit Summary.  MyChart is used to connect with patients for Virtual Visits (Telemedicine).  Patients are able to view lab/test results, encounter notes, upcoming appointments, etc.  Non-urgent messages can be sent to your provider as well.   To learn more about what you can do with MyChart, go to [email or web address].   Other Instructions      Please report to Radiology at the [facility] Main Entrance 30 minutes early for your test.  [address] [place], [place] [address]                         OR   Please report to Radiology at [facility] Main Entrance, medical mall, 30 mins prior to your test.  [address]  [place], [place]  How to Prepare for Your Cardiac PET/CT Stress Test:  Nothing to eat or drink, except water, 3 hours prior to arrival time.  NO caffeine/decaffeinated products, or chocolate 12 hours prior to arrival. (Please note decaffeinated beverages (teas/coffees) still contain caffeine).  If you have caffeine  within 12 hours prior, the test will need to be rescheduled.  Medication instructions: Do not take erectile dysfunction medications for 72 hours prior to test (sildenafil, tadalafil) Do not take nitrates (isosorbide mononitrate, Ranexa) the day before or day of test Do not take tamsulosin the day before or morning of test Hold theophylline containing medications for 12 hours. Hold Dipyridamole 48 hours prior to the test.  Diabetic Preparation: If able  to eat breakfast prior to 3 hour fasting, you may take all medications, including your insulin. Do not worry if you miss your breakfast dose of insulin - start at your next meal. If you do not eat prior to 3 hour fast-Hold all diabetes (oral and insulin) medications. Patients who wear a continuous glucose monitor MUST remove the device prior to scanning.  You may take your remaining medications with water.  NO perfume, cologne or lotion on chest or abdomen area. FEMALES - Please avoid wearing dresses to this appointment.  Total time is 1 to 2 hours; you may want to bring reading material for the waiting time.  IF YOU THINK YOU MAY BE PREGNANT, OR ARE NURSING PLEASE INFORM THE TECHNOLOGIST.  In preparation for your appointment, medication and supplies will be purchased.  Appointment availability is limited, so if you need to cancel or reschedule, please call the Radiology Department Scheduler at [phone] 24 hours in advance to avoid a cancellation fee of $100.00  What to Expect When you Arrive:  Once you arrive and check in for your appointment, you will be taken to a preparation room within the Radiology Department.  A technologist or Nurse will obtain your medical history, verify that you are correctly prepped for the exam, and explain the procedure.  Afterwards, an IV will be started in your arm and electrodes will be placed on your skin for EKG monitoring during the stress portion of the exam. Then you will be escorted to the PET/CT scanner.  There, staff will get you positioned on the scanner and obtain a blood pressure and EKG.  During the exam, you will continue to be connected to the EKG and blood pressure machines.  A small, safe amount of a radioactive tracer will be injected in your IV to obtain a series of pictures of your heart along with an injection of a stress agent.    After your Exam:  It is recommended that you eat a meal and drink a caffeinated beverage to counter act any  effects of the stress agent.  Drink plenty of fluids for the remainder of the day and urinate frequently for the first couple of hours after the exam.  Your doctor will inform you of your test results within 7-10 business days.  For more information and frequently asked questions, please visit our website: [email or web address]  For questions about your test or how to prepare for your test, please call: Cardiac Imaging Nurse Navigators Office: [phone]  For billing questions, please call [phone].

## 2024-08-05 NOTE — Progress Notes (Signed)
 " Cardiology Office Note:  .   Date:  08/05/2024  ID:  Cynthia Jenkins, DOB 1941-02-25, MRN [id number] PCP: Janey Santos, MD  [facility] Providers Cardiologist:  None    History of Present Illness: .     Discussed the use of AI scribe software for clinical note transcription with the patient, who gave verbal consent to proceed.  History of Present Illness Cynthia Jenkins is an 84 year old female with hypertension and hyperlipidemia who presents with a recent syncopal episode. She is accompanied by her husband, Shirlyn Savin.  Syncope and transient loss of consciousness - Syncopal episode on June 22, 2024, while at a salon - Unresponsive for approximately fifteen minutes, described as a 'staring spell' - No prodromal symptoms prior to the event - Blood pressure in the ER was 84/64 mmHg, improved after two 50 cc normal saline boluses - Similar episode five years ago with fall and injury to nose, forehead, and hand, followed by unresponsiveness in the hospital - No palpitations or racing heartbeats associated with episodes - No history of seizures  Cardiac rhythm and arrhythmia evaluation - EKG in ER showed sinus rhythm with premature atrial contractions (PACs) - 11-day Zio patch recorded heart rate range 48-176 bpm, average 66 bpm - 213 episodes of supraventricular tachycardia (SVT); fastest 5 beats at 176 bpm, longest 15 beats at 122 bpm - 22% burden of PACs and rare premature ventricular contractions (PVCs) in atrial couplets - No sustained arrhythmias or triggered events detected - No history of atrial fibrillation  Blood pressure and hypertension management - History of hypertension - Current antihypertensive medications: amlodipine  5 mg, carvedilol  12.5 mg twice daily, losartan -HCTZ 100-12.5 mg daily - Blood pressure in ER was 84/64 mmHg during syncopal episode, normalized after IV fluids and normotensive today  Peripheral edema - Swelling in legs while on  amlodipine  - Edema improved after discontinuation of amlodipine   Carotid artery disease screening - Carotid duplex on July 02, 2024: 1-39% stenosis in right internal carotid artery, no stenosis in left internal carotid artery  Lipid management - History of hyperlipidemia - Current medication: pravastatin  40 mg  Lifestyle and other relevant history - No tobacco or alcohol  use - Consumes two to three cups of coffee daily - Good sleep quality, no known sleep apnea, occasional daytime naps  Dyspnea - Husband notes that she has seemed more short of breath lately and occasionally has tachypnea and even gets short of breath with talking on the phone.  Denies orthopnea          ROS: Remaining review of systems negative  Studies Reviewed: .        Results Diagnostic EKG (06/22/2024): Sinus rhythm with premature atrial contractions Ambulatory cardiac monitor (07/19/2024): Heart rate 48-176 bpm, average 66 bpm; 213 episodes of supraventricular tachycardia, fastest lasting 5 beats at 176 bpm, longest lasting 15 beats at 122 bpm; frequent premature atrial contractions with 22% burden; rare premature ventricular contractions in atrial couplets; no sustained arrhythmias; no triggered events Carotid duplex (07/02/2024): 1-39% stenosis in right internal carotid artery, no stenosis in left internal carotid artery, otherwise normal Cardiac stress test (2009): Normal Risk Assessment/Calculations:             Physical Exam:   VS:  BP 130/78   Pulse 74   Ht 5' 7 (1.702 m)   Wt 175 lb 11.2 oz (79.7 kg)   SpO2 96%   BMI 27.52 kg/m    Wt Readings from Last 3 Encounters:  08/05/24 175 lb 11.2 oz (79.7 kg)  06/22/24 170 lb (77.1 kg)  07/03/22 165 lb 5.5 oz (75 kg)    GEN: Well nourished, well developed in no acute distress NECK: No JVD; No carotid bruits CARDIAC: RRR with extrasystoles, no murmurs, no rubs, no gallops RESPIRATORY: Bibasilar rales, no wheezing or rhonchi  ABDOMEN:  Soft, non-tender, non-distended EXTREMITIES:  No edema; No deformity   ASSESSMENT AND PLAN: .    Assessment and Plan Assessment & Plan Paroxysmal supraventricular tachycardia with frequent premature atrial contractions Frequent PACs with 22% burden, over 200 SVT episodes. Fastest SVT at 176 bpm, longest at 122 bpm. No sustained arrhythmias.  No A-fib noted however frequent PACs does put the patient at risk of A-fib - Discontinued carvedilol  due to tenuous BP. - Initiated metoprolol  succinate 25 mg once daily. - Ordered echocardiogram to assess cardiac function. - Ordered PET/CT stress test for ischemic evaluation - Will consider EP evaluation pending above workup  Syncope and collapse Described as staring spells that can last for 10 to 15 minutes.  Unlikely cardiac in nature as patient did not lose consciousness per se given the description, potentially undiagnosed seizure activity - Recommend discussion with PCP and consider neurology referral  Evaluation for possible heart failure Dyspnea and rales suggest possible fluid overload. No history of heart failure or myocardial infarction. Blood pressure fluctuations noted. - Ordered proBNP to assess heart function. - Ordered chest x-ray to evaluate for fluid overload. - Echo ordered - Will consider diuresis pending above workup  Essential hypertension Blood pressure fluctuations possibly related to medication adjustments. Switched from carvedilol  to metoprolol  succinate to manage blood pressure and PACs/SVT. - Monitor blood pressure closely after switching to metoprolol  succinate. - Adjust diuretic component of losartan -HCTZ (currently 100-12.5 mg) if blood pressure increases.  Hyperlipidemia Pravastatin  40 mg daily. - Continue pravastatin  40 mg daily.       Informed Consent   Shared Decision Making/Informed Consent The risks [chest pain, shortness of breath, cardiac arrhythmias, dizziness, blood pressure fluctuations,  myocardial infarction, stroke/transient ischemic attack, nausea, vomiting, allergic reaction, radiation exposure, metallic taste sensation and life-threatening complications (estimated to be 1 in 10,000)], benefits (risk stratification, diagnosing coronary artery disease, treatment guidance) and alternatives of a cardiac PET stress test were discussed in detail with Cynthia Jenkins and she agrees to proceed.       Follow up: 3 months  Signed, Emeline FORBES Calender, DO  08/05/2024 2:14 PM    [facility] "

## 2024-08-06 ENCOUNTER — Ambulatory Visit: Payer: Self-pay | Admitting: Internal Medicine

## 2024-08-06 LAB — PRO B NATRIURETIC PEPTIDE: NT-Pro BNP: 122 pg/mL (ref 0–738)

## 2024-08-11 NOTE — Telephone Encounter (Signed)
 Pt returning call. Please advise.

## 2024-08-24 ENCOUNTER — Ambulatory Visit

## 2024-09-14 ENCOUNTER — Ambulatory Visit (HOSPITAL_COMMUNITY)

## 2024-09-15 ENCOUNTER — Other Ambulatory Visit (HOSPITAL_COMMUNITY)

## 2024-11-03 ENCOUNTER — Ambulatory Visit: Admitting: Internal Medicine
